# Patient Record
Sex: Female | Born: 1965
Health system: Southern US, Community
[De-identification: ages and names within clinical notes are randomized; demographics above are authoritative.]

## PROBLEM LIST (undated history)

## (undated) DIAGNOSIS — Z860100 Personal history of colon polyps, unspecified: Secondary | ICD-10-CM

## (undated) DIAGNOSIS — F319 Bipolar disorder, unspecified: Secondary | ICD-10-CM

## (undated) DIAGNOSIS — F209 Schizophrenia, unspecified: Secondary | ICD-10-CM

## (undated) DIAGNOSIS — J302 Other seasonal allergic rhinitis: Secondary | ICD-10-CM

## (undated) DIAGNOSIS — N390 Urinary tract infection, site not specified: Secondary | ICD-10-CM

## (undated) DIAGNOSIS — F79 Unspecified intellectual disabilities: Secondary | ICD-10-CM

## (undated) DIAGNOSIS — D649 Anemia, unspecified: Secondary | ICD-10-CM

## (undated) HISTORY — DX: Other seasonal allergic rhinitis: J30.2

## (undated) HISTORY — DX: Urinary tract infection, site not specified: N39.0

## (undated) HISTORY — DX: Personal history of colon polyps, unspecified: Z86.0100

## (undated) HISTORY — DX: Personal history of colonic polyps: Z86.010

## (undated) HISTORY — PX: NO PAST SURGERIES: SHX2092

---

## 2011-06-19 ENCOUNTER — Inpatient Hospital Stay: Payer: Self-pay | Admitting: Psychiatry

## 2012-05-12 ENCOUNTER — Encounter: Payer: Self-pay | Admitting: Obstetrics and Gynecology

## 2014-05-20 DIAGNOSIS — N760 Acute vaginitis: Secondary | ICD-10-CM | POA: Diagnosis not present

## 2014-05-20 DIAGNOSIS — R34 Anuria and oliguria: Secondary | ICD-10-CM | POA: Diagnosis not present

## 2014-07-08 DIAGNOSIS — D72819 Decreased white blood cell count, unspecified: Secondary | ICD-10-CM | POA: Diagnosis not present

## 2014-07-08 DIAGNOSIS — E876 Hypokalemia: Secondary | ICD-10-CM | POA: Diagnosis not present

## 2014-07-08 DIAGNOSIS — R1032 Left lower quadrant pain: Secondary | ICD-10-CM | POA: Diagnosis not present

## 2014-10-17 ENCOUNTER — Ambulatory Visit: Payer: Self-pay | Admitting: Physician Assistant

## 2014-10-17 DIAGNOSIS — R350 Frequency of micturition: Secondary | ICD-10-CM | POA: Diagnosis not present

## 2014-10-17 DIAGNOSIS — F319 Bipolar disorder, unspecified: Secondary | ICD-10-CM | POA: Diagnosis not present

## 2014-10-17 LAB — URINALYSIS, COMPLETE
Bacteria: NEGATIVE
Bilirubin,UR: NEGATIVE
Blood: NEGATIVE
Glucose,UR: NEGATIVE
Ketone: NEGATIVE
LEUKOCYTE ESTERASE: NEGATIVE
Nitrite: NEGATIVE
Ph: 6 (ref 5.0–8.0)
Protein: NEGATIVE
RBC,UR: NONE SEEN /HPF (ref 0–5)
SPECIFIC GRAVITY: 1.005 (ref 1.000–1.030)

## 2014-10-19 LAB — URINE CULTURE

## 2015-06-28 DIAGNOSIS — R319 Hematuria, unspecified: Secondary | ICD-10-CM | POA: Diagnosis not present

## 2015-06-28 DIAGNOSIS — N390 Urinary tract infection, site not specified: Secondary | ICD-10-CM | POA: Diagnosis not present

## 2015-07-07 ENCOUNTER — Ambulatory Visit
Admission: EM | Admit: 2015-07-07 | Discharge: 2015-07-07 | Disposition: A | Discharge status: Home / Self Care | Payer: Medicare Other | Attending: Family Medicine | Admitting: Family Medicine

## 2015-07-07 ENCOUNTER — Encounter: Payer: Self-pay | Admitting: Emergency Medicine

## 2015-07-07 ENCOUNTER — Ambulatory Visit: Payer: Medicare Other

## 2015-07-07 DIAGNOSIS — X58XXXA Exposure to other specified factors, initial encounter: Secondary | ICD-10-CM | POA: Diagnosis not present

## 2015-07-07 DIAGNOSIS — Z79899 Other long term (current) drug therapy: Secondary | ICD-10-CM | POA: Insufficient documentation

## 2015-07-07 DIAGNOSIS — M25571 Pain in right ankle and joints of right foot: Secondary | ICD-10-CM | POA: Diagnosis present

## 2015-07-07 DIAGNOSIS — S8263XA Displaced fracture of lateral malleolus of unspecified fibula, initial encounter for closed fracture: Secondary | ICD-10-CM | POA: Diagnosis not present

## 2015-07-07 DIAGNOSIS — S8261XA Displaced fracture of lateral malleolus of right fibula, initial encounter for closed fracture: Secondary | ICD-10-CM

## 2015-07-07 HISTORY — DX: Unspecified intellectual disabilities: F79

## 2015-07-07 MED ORDER — TRAMADOL HCL 50 MG PO TABS: 50.0000 mg | ORAL_TABLET | Freq: Three times a day (TID) | ORAL | Status: DC | PRN

## 2015-07-07 MED ORDER — MELOXICAM 15 MG PO TABS: 15.0000 mg | ORAL_TABLET | Freq: Every day | ORAL | Status: DC | PRN

## 2015-07-07 NOTE — Discharge Instructions (Signed)
Take medication as prescribed. Apply ice and elevate. Keep in splint. Use crutches.  Follow up with orthopedic next week. See above to call today to schedule for follow up.   Return to Urgent care for new or worsening concerns.   Ankle Fracture A fracture is a break in a bone. The ankle joint is made up of three bones. These include the lower (distal)sections of your lower leg bones, called the tibia and fibula, along with a bone in your foot, called the talus. Depending on how bad the break is and if more than one ankle joint bone is broken, a cast or splint is used to protect and keep your injured bone from moving while it heals. Sometimes, surgery is required to help the fracture heal properly.  There are two general types of fractures:  Stable fracture. This includes a single fracture line through one bone, with no injury to ankle ligaments. A fracture of the talus that does not have any displacement (movement of the bone on either side of the fracture line) is also stable.  Unstable fracture. This includes more than one fracture line through one or more bones in the ankle joint. It also includes fractures that have displacement of the bone on either side of the fracture line. CAUSES  A direct blow to the ankle.   Quickly and severely twisting your ankle.  Trauma, such as a car accident or falling from a significant height. RISK FACTORS You may be at a higher risk of ankle fracture if:  You have certain medical conditions.  You are involved in high-impact sports.  You are involved in a high-impact car accident. SIGNS AND SYMPTOMS   Tender and swollen ankle.  Bruising around the injured ankle.  Pain on movement of the ankle.  Difficulty walking or putting weight on the ankle.  A cold foot below the site of the ankle injury. This can occur if the blood vessels passing through your injured ankle were also damaged.  Numbness in the foot below the site of the ankle  injury. DIAGNOSIS  An ankle fracture is usually diagnosed with a physical exam and X-rays. A CT scan may also be required for complex fractures. TREATMENT  Stable fractures are treated with a cast or splint and using crutches to avoid putting weight on your injured ankle. This is followed by an ankle strengthening program. Some patients require a special type of cast, depending on other medical problems they may have. Unstable fractures require surgery to ensure the bones heal properly. Your health care provider will tell you what type of fracture you have and the best treatment for your condition. HOME CARE INSTRUCTIONS   Review correct crutch use with your health care provider and use your crutches as directed. Safe use of crutches is extremely important. Misuse of crutches can cause you to fall or cause injury to nerves in your hands or armpits.  Do not put weight or pressure on the injured ankle until directed by your health care provider.  To lessen the swelling, keep the injured leg elevated while sitting or lying down.  Apply ice to the injured area:  Put ice in a plastic bag.  Place a towel between your cast and the bag.  Leave the ice on for 20 minutes, 2-3 times a day.  If you have a plaster or fiberglass cast:  Do not try to scratch the skin under the cast with any objects. This can increase your risk of skin infection.  Check  the skin around the cast every day. You may put lotion on any red or sore areas.  Keep your cast dry and clean.  If you have a plaster splint:  Wear the splint as directed.  You may loosen the elastic around the splint if your toes become numb, tingle, or turn cold or blue.  Do not put pressure on any part of your cast or splint; it may break. Rest your cast only on a pillow the first 24 hours until it is fully hardened.  Your cast or splint can be protected during bathing with a plastic bag sealed to your skin with medical tape. Do not lower the  cast or splint into water.  Take medicines as directed by your health care provider. Only take over-the-counter or prescription medicines for pain, discomfort, or fever as directed by your health care provider.  Do not drive a vehicle until your health care provider specifically tells you it is safe to do so.  If your health care provider has given you a follow-up appointment, it is very important to keep that appointment. Not keeping the appointment could result in a chronic or permanent injury, pain, and disability. If you have any problem keeping the appointment, call the facility for assistance. SEEK MEDICAL CARE IF: You develop increased swelling or discomfort. SEEK IMMEDIATE MEDICAL CARE IF:   Your cast gets damaged or breaks.  You have continued severe pain.  You develop new pain or swelling after the cast was put on.  Your skin or toenails below the injury turn blue or gray.  Your skin or toenails below the injury feel cold, numb, or have loss of sensitivity to touch.  There is a bad smell or pus draining from under the cast. MAKE SURE YOU:   Understand these instructions.  Will watch your condition.  Will get help right away if you are not doing well or get worse. Document Released: 10/19/2000 Document Revised: 10/27/2013 Document Reviewed: 05/21/2013 [facility] Patient Information 2015 [place], [place]. This information is not intended to replace advice given to you by your health care provider. Make sure you discuss any questions you have with your health care provider.

## 2015-07-07 NOTE — ED Provider Notes (Signed)
[facility] Emergency Department Provider Note  ____________________________________________  Time seen: Approximately 10:51 AM  I have reviewed the triage vital signs and the nursing notes.   HISTORY  Chief Complaint Ankle Pain   HPI Tina Clark is a 49 y.o. female presents for complaints of right ankle pain. States going down her steps at home last night and missed next to last step and rolled right ankle. States the feel with right ankle underneath her. Denies head injury or LOC. States fell in sitting position. Denies neck or back pain. States pain is primarily with weight bearing. States pain at rest is minimal, pain with ambulating 5/10 and aching. Denies pain radiation. Denies numbness or tingling sensations.    Past Medical History  Diagnosis Date  . Mental impairment     There are no active problems to display for this patient.   History reviewed. No pertinent past surgical history.  Current Outpatient Rx  Name  Route  Sig  Dispense  Refill  . benztropine (COGENTIN) 2 MG tablet   Oral   Take 2 mg by mouth 2 (two) times daily.         . haloperidol (HALDOL) 5 MG tablet   Oral   Take 5 mg by mouth 2 (two) times daily.         . haloperidol decanoate (HALDOL DECANOATE) 50 MG/ML injection   Intramuscular   Inject 50 mg into the muscle every 28 (twenty-eight) days.          PCP in [place]  Allergies Review of patient's allergies indicates no known allergies.  History reviewed. No pertinent family history.  Social History Social History  Substance Use Topics  . Smoking status: Never Smoker   . Smokeless tobacco: None  . Alcohol Use: No    Review of Systems Constitutional: No fever/chills Eyes: No visual changes. ENT: No sore throat. Cardiovascular: Denies chest pain. Respiratory: Denies shortness of breath. Gastrointestinal: No abdominal pain.  No nausea, no vomiting.  No diarrhea.  No constipation. Genitourinary:  Negative for dysuria. Musculoskeletal: Negative for back pain.right ankle pain.  Skin: Negative for rash. Neurological: Negative for headaches, focal weakness or numbness.  10-point ROS otherwise negative.  ____________________________________________   PHYSICAL EXAM:  VITAL SIGNS: ED Triage Vitals  Enc Vitals Group     BP 07/07/15 1035 122/77 mmHg     Pulse Rate 07/07/15 1035 62     Resp 07/07/15 1035 18     Temp 07/07/15 1035 98.2 F (36.8 C)     Temp Source 07/07/15 1035 Oral     SpO2 07/07/15 1035 98 %     Weight 07/07/15 1035 195 lb (88.451 kg)     Height 07/07/15 1035 6\' 1"  (1.854 m)     Head Cir --      Peak Flow --      Pain Score 07/07/15 1040 5     Pain Loc --      Pain Edu? --      Excl. in [place]? --     Constitutional: Alert and oriented. Well appearing and in no acute distress. Eyes: Conjunctivae are normal. PERRL. EOMI. Head: Atraumatic. Nontender. No swelling or ecchymosis.  Nose: No congestion/rhinnorhea.  Mouth/Throat: Mucous membranes are moist.  Oropharynx non-erythematous. Neck: No stridor.  No cervical spine tenderness to palpation. Hematological/Lymphatic/Immunilogical: No cervical lymphadenopathy. Cardiovascular: Normal rate, regular rhythm. Grossly normal heart sounds.  Good peripheral circulation. Respiratory: Normal respiratory effort.  No retractions. Lungs CTAB. Gastrointestinal: Soft  and nontender. No distention. Normal Bowel sounds.   Musculoskeletal: No lower or upper extremity tenderness nor edema.  No joint effusions. Bilateral pedal pulses equal and easily palpated. No cervical, thoracic or lumbar tenderness to palpation. Except: right lateral ankle mod TTP with mod swelling, right anterior ankle mild TTP without swelling. Full ROM. Pain with ankle rotation. Bilateral pedal pulses equal and easily palpated. Right foot nontender, Right lower leg otherwise nontender.  Neurologic:  Normal speech and language. No gross focal neurologic deficits  are appreciated. No gait instability. Skin:  Skin is warm, dry and intact. No rash noted. Psychiatric: Mood and affect are normal. Speech and behavior are normal.  ____________________________________________   LABS (all labs ordered are listed, but only abnormal results are displayed)  Labs Reviewed - No data to display ____________________________________________  RADIOLOGY  EXAM: RIGHT ANKLE - COMPLETE 3+ VIEW  COMPARISON: None.  FINDINGS: Lateral greater than medial soft tissue swelling. Avulsion fracture involves the lateral malleolus. Base of fifth metatarsal and talar dome intact.  IMPRESSION: Lateral malleolar avulsion fracture with bimalleolar soft tissue swelling.   Electronically Signed By: Abigail Miyamoto M.D. On: 07/07/2015 11:59      I, Marylene Land, personally viewed and evaluated these images (plain radiographs) as part of my medical decision making.   ____________________________________________   PROCEDURES  Procedure(s) performed:  ocl stirrup splint applied by RN. Crutches given by RN. Neurovascular intact post application.  ____________________________________________   INITIAL IMPRESSION / ASSESSMENT AND PLAN / ED COURSE  Pertinent labs & imaging results that were available during my care of the patient were reviewed by me and considered in my medical decision making (see chart for details).  Well appearing. No acute distress. PResents post mechanical fall on to right ankle yesterday at home, after missing step and rolling ankle. Pain since. Right lateral malleolar avulsion fracture. Placed in stirrup ocl splint and crutches. Counseled no weight bearing, ice and elevation. Follow up with orthopedic next week, information given. PRN tramadol and mobic. Discussed follow-up and return parameters. Patient verbalized understanding and agreed to plan. ____________________________________________   FINAL CLINICAL IMPRESSION(S) / ED  DIAGNOSES  Final diagnoses:  Lateral malleolar fracture, right, closed, initial encounter       Marylene Land, NP 07/07/15 1235

## 2015-07-07 NOTE — ED Notes (Signed)
Pt with right ankle paim  After falling last night

## 2015-07-19 DIAGNOSIS — M79661 Pain in right lower leg: Secondary | ICD-10-CM | POA: Diagnosis not present

## 2015-07-19 DIAGNOSIS — S82831A Other fracture of upper and lower end of right fibula, initial encounter for closed fracture: Secondary | ICD-10-CM | POA: Diagnosis not present

## 2015-08-10 DIAGNOSIS — M25571 Pain in right ankle and joints of right foot: Secondary | ICD-10-CM | POA: Diagnosis not present

## 2015-08-10 DIAGNOSIS — S82831D Other fracture of upper and lower end of right fibula, subsequent encounter for closed fracture with routine healing: Secondary | ICD-10-CM | POA: Diagnosis not present

## 2016-06-09 ENCOUNTER — Ambulatory Visit
Admission: EM | Admit: 2016-06-09 | Discharge: 2016-06-09 | Disposition: A | Discharge status: Home / Self Care | Payer: Medicare Other | Attending: Family Medicine | Admitting: Family Medicine

## 2016-06-09 DIAGNOSIS — Z79899 Other long term (current) drug therapy: Secondary | ICD-10-CM | POA: Insufficient documentation

## 2016-06-09 DIAGNOSIS — N390 Urinary tract infection, site not specified: Secondary | ICD-10-CM

## 2016-06-09 LAB — URINALYSIS COMPLETE WITH MICROSCOPIC (ARMC ONLY)
Bilirubin Urine: NEGATIVE
GLUCOSE, UA: NEGATIVE mg/dL
Hgb urine dipstick: NEGATIVE
Ketones, ur: NEGATIVE mg/dL
NITRITE: NEGATIVE
PROTEIN: NEGATIVE mg/dL
Specific Gravity, Urine: 1.015 (ref 1.005–1.030)
pH: 5.5 (ref 5.0–8.0)

## 2016-06-09 MED ORDER — CIPROFLOXACIN HCL 500 MG PO TABS: 500.0000 mg | ORAL_TABLET | Freq: Two times a day (BID) | ORAL | 0 refills | Status: DC

## 2016-06-09 NOTE — ED Provider Notes (Signed)
CSN: [id number]     Arrival date & time 06/09/16  1547 History   None    Chief Complaint  Patient presents with  . Urinary Tract Infection   HPI The patient Presents today for evaluation of burning sensation with urination. She reports that this is been going on for 2 weeks, denies any new sexual partner or blood in her urine. She does know to increase frequency. She describes a burning sensation and a odor with urination. Presents today for urinalysis and possible treatment UTI. Past Medical History:  Diagnosis Date  . Mental impairment    Past Surgical History:  Procedure Laterality Date  . NO PAST SURGERIES     History reviewed. No pertinent family history. Social History  Substance Use Topics  . Smoking status: Never Smoker  . Smokeless tobacco: Never Used  . Alcohol use No   OB History    No data available     Review of Systems  Constitutional: Negative.   HENT: Negative.   Eyes: Negative.   Respiratory: Negative.   Cardiovascular: Negative.   Gastrointestinal: Negative.   Endocrine: Negative.   Genitourinary: Positive for dysuria, frequency and urgency.  Musculoskeletal: Negative.   Skin: Negative.   Allergic/Immunologic: Negative.   Neurological: Negative.   Hematological: Negative.   Psychiatric/Behavioral: Negative.     Allergies  Review of patient's allergies indicates no known allergies.  Home Medications   Prior to Admission medications   Medication Sig Start Date End Date Taking? Authorizing Provider  benztropine (COGENTIN) 2 MG tablet Take 2 mg by mouth 2 (two) times daily.   Yes Historical Provider, MD  haloperidol decanoate (HALDOL DECANOATE) 50 MG/ML injection Inject 180 mg into the muscle every 28 (twenty-eight) days.    Yes Historical Provider, MD  ciprofloxacin (CIPRO) 500 MG tablet Take 1 tablet (500 mg total) by mouth every 12 (twelve) hours. 06/09/16   Lattie Corns, PA-C  haloperidol (HALDOL) 5 MG tablet Take 5 mg by mouth 2 (two) times  daily.    Historical Provider, MD  traMADol (ULTRAM) 50 MG tablet Take 1 tablet (50 mg total) by mouth every 8 (eight) hours as needed (Do not drive or operate machinery while taking as can cause drowsiness.). 07/07/15   Marylene Land, NP   Meds Ordered and Administered this Visit  Medications - No data to display  BP (!) 114/56 (BP Location: Left Arm)   Pulse 86   Temp 97.8 F (36.6 C) (Tympanic)   Resp 17   Wt 201 lb (91.2 kg)   SpO2 100%   BMI 26.52 kg/m  No data found.  Physical Exam  Constitutional: She appears well-developed and well-nourished. No distress.  HENT:  Head: Normocephalic and atraumatic.  Cardiovascular: Normal rate and regular rhythm.   Abdominal: Soft.  Skin: She is not diaphoretic.    Urgent Care Course   Clinical Course   Procedures (including critical care time)  Labs Review Labs Reviewed  URINALYSIS COMPLETEWITH MICROSCOPIC ([facility] ONLY) - Abnormal; Notable for the following:       Result Value   Leukocytes, UA SMALL (*)    Bacteria, UA RARE (*)    Squamous Epithelial / LPF 0-5 (*)    All other components within normal limits   MDM   1. UTI (lower urinary tract infection)    1.  Treatment options discussed with patient. 2.  Cipro 500mg  BID x 7 days prescribed. 3.  Follow-up with PCP if symptoms persist or worsen.  Lattie Corns, [place] 06/09/16 [phone]

## 2016-06-09 NOTE — ED Triage Notes (Signed)
Patient complains of burning sensation with urination that has now stopped. Patient states that started 4 weeks ago and has noticed some diarrhea that has since stopped. Patient states that she wants to know if she has a UTI. Patient states that she took a bath and she thinks this helped.

## 2016-06-30 ENCOUNTER — Ambulatory Visit
Admission: EM | Admit: 2016-06-30 | Discharge: 2016-06-30 | Disposition: A | Discharge status: Home / Self Care | Payer: Medicare Other | Attending: Family Medicine | Admitting: Family Medicine

## 2016-06-30 DIAGNOSIS — N390 Urinary tract infection, site not specified: Secondary | ICD-10-CM | POA: Diagnosis not present

## 2016-06-30 LAB — URINALYSIS COMPLETE WITH MICROSCOPIC (ARMC ONLY)
BILIRUBIN URINE: NEGATIVE
Glucose, UA: NEGATIVE mg/dL
KETONES UR: NEGATIVE mg/dL
Leukocytes, UA: NEGATIVE
NITRITE: NEGATIVE
PH: 5.5 (ref 5.0–8.0)
Protein, ur: NEGATIVE mg/dL
SPECIFIC GRAVITY, URINE: 1.01 (ref 1.005–1.030)
WBC UA: NONE SEEN WBC/hpf (ref 0–5)

## 2016-06-30 MED ORDER — NITROFURANTOIN MONOHYD MACRO 100 MG PO CAPS: 100.0000 mg | ORAL_CAPSULE | Freq: Two times a day (BID) | ORAL | 0 refills | Status: DC

## 2016-06-30 NOTE — ED Triage Notes (Signed)
As per patient was here onset 2 week for UTI but still has symptoms has frequent urination.

## 2016-06-30 NOTE — Discharge Instructions (Signed)
Take Abx as directed. Follow-up as needed.

## 2016-06-30 NOTE — ED Provider Notes (Signed)
CSN: [id number]     Arrival date & time 06/30/16  1003 History   None    Chief Complaint  Patient presents with  . Recurrent UTI   (Consider location/radiation/quality/duration/timing/severity/associated sxs/prior Treatment) HPI The patient presents today for evaluation of burning sensation with urination ongoing for several days in the past.  Treated with Cipro which relieved her symptoms.  Over the last several days she has had increased odor with urination, denies any new sexual partners or discharge. Past Medical History:  Diagnosis Date  . Mental impairment    Past Surgical History:  Procedure Laterality Date  . NO PAST SURGERIES     No family history on file. Social History  Substance Use Topics  . Smoking status: Never Smoker  . Smokeless tobacco: Never Used  . Alcohol use No   OB History    No data available     Review of Systems  Constitutional: Negative.   HENT: Negative.   Eyes: Negative.   Respiratory: Negative.   Cardiovascular: Negative.   Gastrointestinal: Negative.   Endocrine: Negative.   Genitourinary: Positive for dysuria, frequency and urgency.  Musculoskeletal: Negative.   Skin: Negative.   Allergic/Immunologic: Negative.   Neurological: Negative.   Hematological: Negative.   Psychiatric/Behavioral: Negative.     Allergies  Review of patient's allergies indicates no known allergies.  Home Medications   Prior to Admission medications   Medication Sig Start Date End Date Taking? Authorizing Provider  benztropine (COGENTIN) 2 MG tablet Take 2 mg by mouth 2 (two) times daily.   Yes Historical Provider, MD  ciprofloxacin (CIPRO) 500 MG tablet Take 1 tablet (500 mg total) by mouth every 12 (twelve) hours. 06/09/16  Yes Lattie Corns, PA-C  haloperidol (HALDOL) 5 MG tablet Take 5 mg by mouth 2 (two) times daily.   Yes Historical Provider, MD  haloperidol decanoate (HALDOL DECANOATE) 50 MG/ML injection Inject 180 mg into the muscle every 28  (twenty-eight) days.    Yes Historical Provider, MD  traMADol (ULTRAM) 50 MG tablet Take 1 tablet (50 mg total) by mouth every 8 (eight) hours as needed (Do not drive or operate machinery while taking as can cause drowsiness.). 07/07/15  Yes Marylene Land, NP  nitrofurantoin, macrocrystal-monohydrate, (MACROBID) 100 MG capsule Take 1 capsule (100 mg total) by mouth 2 (two) times daily. 06/30/16   Lattie Corns, PA-C   Meds Ordered and Administered this Visit  Medications - No data to display  BP 129/79 (BP Location: Left Arm)   Pulse 67   Temp 97.8 F (36.6 C) (Tympanic)   Resp 16   Ht 6' (1.829 m)   Wt 207 lb (93.9 kg)   SpO2 99%   BMI 28.07 kg/m  No data found.  Physical Exam Constitutional: She appears well-developed and well-nourished. No distress.  HENT:  Head: Normocephalic and atraumatic.  Cardiovascular: Normal rate and regular rhythm.   Abdominal: Soft.  Skin: She is not diaphoretic.  Urgent Care Course   Clinical Course    Procedures (including critical care time)  Labs Review Labs Reviewed  URINALYSIS COMPLETEWITH MICROSCOPIC ([place]) - Abnormal; Notable for the following:       Result Value   Hgb urine dipstick TRACE (*)    Bacteria, UA RARE (*)    Squamous Epithelial / LPF 0-5 (*)    All other components within normal limits  URINE CULTURE    Imaging Review No results found.  MDM   1. UTI (lower urinary tract infection)   -  Treatment options discussed today with the patient. -  Macrobid 100mg  BID x 7 days, Urine Culture ordered as well. -  Follow-up with PCP if symptoms persist or worsen    Lattie Corns, PA-C 06/30/16 1126

## 2016-07-02 LAB — URINE CULTURE
Culture: NO GROWTH
Special Requests: NORMAL

## 2016-08-13 ENCOUNTER — Ambulatory Visit
Admission: EM | Admit: 2016-08-13 | Discharge: 2016-08-13 | Disposition: A | Discharge status: Home / Self Care | Payer: Medicare Other | Attending: Family Medicine | Admitting: Family Medicine

## 2016-08-13 DIAGNOSIS — S86812A Strain of other muscle(s) and tendon(s) at lower leg level, left leg, initial encounter: Secondary | ICD-10-CM | POA: Diagnosis not present

## 2016-08-13 DIAGNOSIS — Z87440 Personal history of urinary (tract) infections: Secondary | ICD-10-CM | POA: Diagnosis present

## 2016-08-13 DIAGNOSIS — N390 Urinary tract infection, site not specified: Secondary | ICD-10-CM | POA: Diagnosis not present

## 2016-08-13 LAB — URINALYSIS COMPLETE WITH MICROSCOPIC (ARMC ONLY)
BACTERIA UA: NONE SEEN
Bilirubin Urine: NEGATIVE
Glucose, UA: NEGATIVE mg/dL
KETONES UR: NEGATIVE mg/dL
Nitrite: NEGATIVE
PH: 6.5 (ref 5.0–8.0)
PROTEIN: NEGATIVE mg/dL
RBC / HPF: NONE SEEN RBC/hpf (ref 0–5)
Specific Gravity, Urine: 1.01 (ref 1.005–1.030)

## 2016-08-13 NOTE — ED Triage Notes (Signed)
Patient complains of left lower leg pain below the knee that started 3 months ago. Patient states that pain comes and goes. Patient states that the pain started after having a bean and beef burrito.    Patient states that she is also here to follow up about her UTI. Patient states that she has not had any symptoms. Patient states that she wants to check her urine to make sure infection has disappeared.

## 2016-08-15 ENCOUNTER — Telehealth: Payer: Self-pay

## 2016-08-15 NOTE — Telephone Encounter (Signed)
Courtesy call back completed today for patients recent visit at [facility]. Patient did not answer, left message on voicemail to call back with any questions or concerns.   

## 2016-08-15 NOTE — ED Provider Notes (Signed)
[facility]    CSN: [id number] Arrival date & time: 08/13/16  1555     History   Chief Complaint Chief Complaint  Patient presents with  . Urinary Tract Infection  . Leg Pain    left     HPI Tina Clark is a 50 y.o. female.   Patient complains of left lower leg pain below the knee that started 3 months ago. Patient states that pain comes and goes. Denies any injury. Patient states that the pain started after having a bean and beef burrito.    Patient states that she is also here to follow up about her UTI. Patient states that she has not had any symptoms. Patient states that she wants to check her urine to make sure infection has disappeared.         The history is provided by the patient.  Urinary Tract Infection  Leg Pain    Past Medical History:  Diagnosis Date  . Mental impairment     There are no active problems to display for this patient.   Past Surgical History:  Procedure Laterality Date  . NO PAST SURGERIES      OB History    No data available       Home Medications    Prior to Admission medications   Medication Sig Start Date End Date Taking? Authorizing Provider  benztropine (COGENTIN) 2 MG tablet Take 2 mg by mouth 2 (two) times daily.   Yes Historical Provider, MD  haloperidol (HALDOL) 5 MG tablet Take 5 mg by mouth 2 (two) times daily.   Yes Historical Provider, MD  haloperidol decanoate (HALDOL DECANOATE) 50 MG/ML injection Inject 180 mg into the muscle every 28 (twenty-eight) days.    Yes Historical Provider, MD  ciprofloxacin (CIPRO) 500 MG tablet Take 1 tablet (500 mg total) by mouth every 12 (twelve) hours. 06/09/16   Lattie Corns, PA-C  nitrofurantoin, macrocrystal-monohydrate, (MACROBID) 100 MG capsule Take 1 capsule (100 mg total) by mouth 2 (two) times daily. 06/30/16   Lattie Corns, PA-C  traMADol (ULTRAM) 50 MG tablet Take 1 tablet (50 mg total) by mouth every 8 (eight) hours as needed (Do not drive or  operate machinery while taking as can cause drowsiness.). 07/07/15   Marylene Land, NP    Family History History reviewed. No pertinent family history.  Social History Social History  Substance Use Topics  . Smoking status: Never Smoker  . Smokeless tobacco: Never Used  . Alcohol use No     Allergies   Review of patient's allergies indicates no known allergies.   Review of Systems Review of Systems   Physical Exam Triage Vital Signs ED Triage Vitals  Enc Vitals Group     BP 08/13/16 1640 (!) 141/81     Pulse Rate 08/13/16 1640 61     Resp 08/13/16 1640 16     Temp 08/13/16 1640 97.4 F (36.3 C)     Temp Source 08/13/16 1640 Oral     SpO2 08/13/16 1640 100 %     Weight 08/13/16 1641 207 lb (93.9 kg)     Height 08/13/16 1641 6' (1.829 m)     Head Circumference --      Peak Flow --      Pain Score 08/13/16 1642 4     Pain Loc --      Pain Edu? --      Excl. in [place]? --    No data found.  Updated Vital Signs BP (!) 141/81 (BP Location: Left Arm)   Pulse 61   Temp 97.4 F (36.3 C) (Oral)   Resp 16   Ht 6' (1.829 m)   Wt 207 lb (93.9 kg)   SpO2 100%   BMI 28.07 kg/m   Visual Acuity Right Eye Distance:   Left Eye Distance:   Bilateral Distance:    Right Eye Near:   Left Eye Near:    Bilateral Near:     Physical Exam  Constitutional: She appears well-developed and well-nourished. No distress.  Abdominal: Soft. Bowel sounds are normal. She exhibits no distension and no mass. There is no tenderness. There is no rebound and no guarding.  Musculoskeletal:       Left knee: She exhibits normal range of motion, no swelling, no effusion, no ecchymosis, no deformity, no laceration, no erythema, normal alignment, no LCL laxity, normal patellar mobility, no bony tenderness, normal meniscus and no MCL laxity. No tenderness found.       Left lower leg: She exhibits tenderness (over the shin and calf muscle). She exhibits no bony tenderness, no swelling, no edema, no  deformity and no laceration.  Skin: She is not diaphoretic.  Nursing note and vitals reviewed.    UC Treatments / Results  Labs (all labs ordered are listed, but only abnormal results are displayed) Labs Reviewed  URINALYSIS COMPLETEWITH MICROSCOPIC ([place]) - Abnormal; Notable for the following:       Result Value   Color, Urine STRAW (*)    Hgb urine dipstick TRACE (*)    Leukocytes, UA TRACE (*)    Squamous Epithelial / LPF 0-5 (*)    All other components within normal limits    EKG  EKG Interpretation None       Radiology No results found.  Procedures Procedures (including critical care time)  Medications Ordered in UC Medications - No data to display   Initial Impression / Assessment and Plan / UC Course  I have reviewed the triage vital signs and the nursing notes.  Pertinent labs & imaging results that were available during my care of the patient were reviewed by me and considered in my medical decision making (see chart for details).  Clinical Course      Final Clinical Impressions(s) / UC Diagnoses   Final diagnoses:  Strain of calf muscle, left, initial encounter    New Prescriptions Discharge Medication List as of 08/13/2016  5:17 PM     1.  diagnosis reviewed with patient 2. Recommend supportive treatment with rest, ice, stretching 3. Follow-up prn if symptoms worsen or don't improve   Norval Gable, MD 08/15/16 1101

## 2017-02-05 ENCOUNTER — Ambulatory Visit
Admission: EM | Admit: 2017-02-05 | Discharge: 2017-02-05 | Disposition: A | Discharge status: Home / Self Care | Payer: Medicare Other | Attending: Family Medicine | Admitting: Family Medicine

## 2017-02-05 DIAGNOSIS — G3184 Mild cognitive impairment, so stated: Secondary | ICD-10-CM | POA: Insufficient documentation

## 2017-02-05 DIAGNOSIS — N390 Urinary tract infection, site not specified: Secondary | ICD-10-CM | POA: Insufficient documentation

## 2017-02-05 DIAGNOSIS — R300 Dysuria: Secondary | ICD-10-CM | POA: Diagnosis not present

## 2017-02-05 LAB — URINALYSIS, COMPLETE (UACMP) WITH MICROSCOPIC
Glucose, UA: NEGATIVE mg/dL
KETONES UR: 15 mg/dL — AB
NITRITE: NEGATIVE
PH: 6 (ref 5.0–8.0)
Protein, ur: NEGATIVE mg/dL
SPECIFIC GRAVITY, URINE: 1.02 (ref 1.005–1.030)

## 2017-02-05 MED ORDER — NITROFURANTOIN MONOHYD MACRO 100 MG PO CAPS: 100.0000 mg | ORAL_CAPSULE | Freq: Two times a day (BID) | ORAL | 0 refills | Status: DC

## 2017-02-05 NOTE — ED Provider Notes (Signed)
[facility]    CSN: [id number] Arrival date & time: 02/05/17  1659     History   Chief Complaint Chief Complaint  Patient presents with  . Urinary Tract Infection    HPI Tina Clark is a 51 y.o. female.   The history is provided by the patient.  Urinary Tract Infection  Associated symptoms: no abdominal pain, no fever, no flank pain, no genital lesions, no nausea, no vaginal discharge and no vomiting   Dysuria  Pain quality:  Burning Pain severity:  Moderate Onset quality:  Sudden Duration:  7 days Timing:  Constant Progression:  Worsening Chronicity:  New Recent urinary tract infections: no   Relieved by:  None tried Worsened by:  Nothing Ineffective treatments:  None tried Associated symptoms: no abdominal pain, no fever, no flank pain, no genital lesions, no nausea, no vaginal discharge and no vomiting   Risk factors: no hx of pyelonephritis, no hx of urolithiasis, no kidney transplant, not pregnant, no recurrent urinary tract infections, no renal cysts, no renal disease, not sexually active, no sexually transmitted infections, no single kidney and no urinary catheter     Past Medical History:  Diagnosis Date  . Mental impairment     There are no active problems to display for this patient.   Past Surgical History:  Procedure Laterality Date  . NO PAST SURGERIES      OB History    No data available       Home Medications    Prior to Admission medications   Medication Sig Start Date End Date Taking? Authorizing Provider  benztropine (COGENTIN) 2 MG tablet Take 2 mg by mouth 2 (two) times daily.   Yes Historical Provider, MD  haloperidol (HALDOL) 5 MG tablet Take 5 mg by mouth 2 (two) times daily.   Yes Historical Provider, MD  haloperidol decanoate (HALDOL DECANOATE) 50 MG/ML injection Inject 180 mg into the muscle every 28 (twenty-eight) days.     Historical Provider, MD  nitrofurantoin, macrocrystal-monohydrate, (MACROBID) 100 MG  capsule Take 1 capsule (100 mg total) by mouth 2 (two) times daily. 02/05/17   Norval Gable, MD    Family History History reviewed. No pertinent family history.  Social History Social History  Substance Use Topics  . Smoking status: Never Smoker  . Smokeless tobacco: Never Used  . Alcohol use No     Allergies   Patient has no known allergies.   Review of Systems Review of Systems  Constitutional: Negative for fever.  Gastrointestinal: Negative for abdominal pain, nausea and vomiting.  Genitourinary: Positive for dysuria. Negative for flank pain and vaginal discharge.     Physical Exam Triage Vital Signs ED Triage Vitals  Enc Vitals Group     BP 02/05/17 1810 135/81     Pulse Rate 02/05/17 1810 81     Resp 02/05/17 1810 18     Temp 02/05/17 1810 97.5 F (36.4 C)     Temp Source 02/05/17 1810 Oral     SpO2 02/05/17 1810 99 %     Weight 02/05/17 1811 209 lb (94.8 kg)     Height 02/05/17 1811 6\' 1"  (1.854 m)     Head Circumference --      Peak Flow --      Pain Score --      Pain Loc --      Pain Edu? --      Excl. in [place]? --    No data found.  Updated Vital Signs BP 135/81 (BP Location: Left Arm)   Pulse 81   Temp 97.5 F (36.4 C) (Oral)   Resp 18   Ht 6\' 1"  (1.854 m)   Wt 209 lb (94.8 kg)   SpO2 99%   BMI 27.57 kg/m   Visual Acuity Right Eye Distance:   Left Eye Distance:   Bilateral Distance:    Right Eye Near:   Left Eye Near:    Bilateral Near:     Physical Exam  Constitutional: She appears well-developed and well-nourished. No distress.  Abdominal: Soft. Bowel sounds are normal. She exhibits no distension and no mass. There is no tenderness. There is no rebound and no guarding.  Skin: She is not diaphoretic.  Nursing note and vitals reviewed.    UC Treatments / Results  Labs (all labs ordered are listed, but only abnormal results are displayed) Labs Reviewed  URINALYSIS, COMPLETE (UACMP) WITH MICROSCOPIC - Abnormal; Notable for the  following:       Result Value   Hgb urine dipstick TRACE (*)    Bilirubin Urine SMALL (*)    Ketones, ur 15 (*)    Leukocytes, UA TRACE (*)    Squamous Epithelial / LPF 6-30 (*)    Bacteria, UA RARE (*)    All other components within normal limits    EKG  EKG Interpretation None       Radiology No results found.  Procedures Procedures (including critical care time)  Medications Ordered in UC Medications - No data to display   Initial Impression / Assessment and Plan / UC Course  I have reviewed the triage vital signs and the nursing notes.  Pertinent labs & imaging results that were available during my care of the patient were reviewed by me and considered in my medical decision making (see chart for details).       Final Clinical Impressions(s) / UC Diagnoses   Final diagnoses:  Dysuria    New Prescriptions Discharge Medication List as of 02/05/2017  6:30 PM    START taking these medications   Details  nitrofurantoin, macrocrystal-monohydrate, (MACROBID) 100 MG capsule Take 1 capsule (100 mg total) by mouth 2 (two) times daily., Starting Tue 02/05/2017, Normal       1. Lab results and diagnosis reviewed with patient 2. rx as per orders above; reviewed possible side effects, interactions, risks and benefits  3. Recommend supportive treatment with increased fluids, otc analgesics prn 4. Follow-up prn if symptoms worsen or don't improve   Norval Gable, MD 02/05/17 [phone]

## 2017-02-05 NOTE — ED Triage Notes (Signed)
Pt c/o burning while urinating.

## 2017-02-08 ENCOUNTER — Telehealth: Payer: Self-pay

## 2017-03-16 ENCOUNTER — Ambulatory Visit
Admission: EM | Admit: 2017-03-16 | Discharge: 2017-03-16 | Disposition: A | Discharge status: Home / Self Care | Payer: Medicare Other | Attending: Family Medicine | Admitting: Family Medicine

## 2017-03-16 ENCOUNTER — Ambulatory Visit: Payer: Medicare Other

## 2017-03-16 DIAGNOSIS — Z79899 Other long term (current) drug therapy: Secondary | ICD-10-CM | POA: Insufficient documentation

## 2017-03-16 DIAGNOSIS — M25562 Pain in left knee: Secondary | ICD-10-CM | POA: Diagnosis not present

## 2017-03-16 DIAGNOSIS — W19XXXA Unspecified fall, initial encounter: Secondary | ICD-10-CM | POA: Diagnosis not present

## 2017-03-16 DIAGNOSIS — S8992XA Unspecified injury of left lower leg, initial encounter: Secondary | ICD-10-CM | POA: Diagnosis not present

## 2017-03-16 DIAGNOSIS — S8991XA Unspecified injury of right lower leg, initial encounter: Secondary | ICD-10-CM | POA: Diagnosis not present

## 2017-03-16 DIAGNOSIS — M25561 Pain in right knee: Secondary | ICD-10-CM | POA: Diagnosis not present

## 2017-03-16 DIAGNOSIS — Z87440 Personal history of urinary (tract) infections: Secondary | ICD-10-CM

## 2017-03-16 LAB — URINALYSIS, COMPLETE (UACMP) WITH MICROSCOPIC
BACTERIA UA: NONE SEEN
Bilirubin Urine: NEGATIVE
Glucose, UA: NEGATIVE mg/dL
HGB URINE DIPSTICK: NEGATIVE
Ketones, ur: NEGATIVE mg/dL
Nitrite: NEGATIVE
PROTEIN: NEGATIVE mg/dL
RBC / HPF: NONE SEEN RBC/hpf (ref 0–5)
Specific Gravity, Urine: <1.005 — ABNORMAL LOW (ref 1.005–1.030)
pH: 5.5 (ref 5.0–8.0)

## 2017-03-16 NOTE — ED Provider Notes (Signed)
[facility] ____________________________________________  Time seen: Approximately 9:56 AM  I have reviewed the triage vital signs and the nursing notes.   HISTORY  Chief Complaint Knee Pain (right)  HPI Tina Clark is a 51 y.o. female present for evaluation of bilateral knee pain after a fall that occurred less than home. Patient states that she was going down her steps outside and was on the third from the bottom step, misstepped causing her to fall forward. Patient states that she fell directly on her knees bilaterally. Patient reports she had more pain to her knees yesterday and currently but states wanted to have evaluation. Patient states right   knee is currently nontender, states mild left knee pain at this time. States pain to both knees increase with ambulation. Reports resting and over-the-counter Tylenol or ibuprofen helps, denies other alleviating factors. Denies other aggravating factors. Denies pain radiation, paresthesias or other injury. Patient states she landed directly on her knees and did not hurt any other areas. Denies head injury or loss of consciousness. Reports has remained ambulatory since.  She also states that she was seen in urgent care a few weeks ago for urinary tract infection, and states that she completed antibiotics. Patient states that she also wanted her urine to be evaluated today to make sure her urine clear the infection. States no dysuria, vaginal complaints, abdominal pain or back pain.  Denies chest pain, shortness of breath, abdominal pain, or rash. Denies recent sickness. Denies recent antibiotic use.   No LMP recorded. Patient is not currently having periods (Reason: Perimenopausal).      Past Medical History:  Diagnosis Date  . Mental impairment     There are no active problems to display for this patient.   Past Surgical History:  Procedure Laterality Date  . NO PAST SURGERIES       No current  facility-administered medications for this encounter.   Current Outpatient Prescriptions:  .  benztropine (COGENTIN) 2 MG tablet, Take 2 mg by mouth 2 (two) times daily., Disp: , Rfl:  .  haloperidol decanoate (HALDOL DECANOATE) 50 MG/ML injection, Inject 180 mg into the muscle every 28 (twenty-eight) days. , Disp: , Rfl:  .  haloperidol (HALDOL) 5 MG tablet, Take 5 mg by mouth 2 (two) times daily., Disp: , Rfl:   Allergies Patient has no known allergies.  History reviewed. No pertinent family history.  Social History Social History  Substance Use Topics  . Smoking status: Never Smoker  . Smokeless tobacco: Never Used  . Alcohol use No    Review of Systems Constitutional: No fever/chills Eyes: No visual changes. Cardiovascular: Denies chest pain. Respiratory: Denies shortness of breath. Gastrointestinal: No abdominal pain.  No nausea, no vomiting.  No diarrhea.  No constipation. Genitourinary: Negative for dysuria. Musculoskeletal: Negative for back pain.As above. Skin: Negative for rash.  ____________________________________________   PHYSICAL EXAM:  VITAL SIGNS: ED Triage Vitals  Enc Vitals Group     BP 03/16/17 0922 118/73     Pulse Rate 03/16/17 0922 73     Resp 03/16/17 0922 18     Temp 03/16/17 0922 98.2 F (36.8 C)     Temp Source 03/16/17 0922 Oral     SpO2 03/16/17 0922 99 %     Weight 03/16/17 0922 209 lb (94.8 kg)     Height 03/16/17 0922 6\' 1"  (1.854 m)     Head Circumference --      Peak Flow --  Pain Score 03/16/17 0923 2     Pain Loc --      Pain Edu? --      Excl. in [place]? --     Constitutional: Alert and oriented. Well appearing and in no acute distress. Eyes: Conjunctivae are normal. PERRL. EOMI. ENT      Head: Normocephalic and atraumatic.      Nose: No congestion/rhinnorhea.      Mouth/Throat: Mucous membranes are moist.Oropharynx non-erythematous. Neck: No stridor. Supple without meningismus.  Hematological/Lymphatic/Immunilogical:  No cervical lymphadenopathy. Cardiovascular: Normal rate, regular rhythm. Grossly normal heart sounds.  Good peripheral circulation. Respiratory: Normal respiratory effort without tachypnea nor retractions. Breath sounds are clear and equal bilaterally. No wheezes, rales, rhonchi. Gastrointestinal: Soft and nontender. No distention. Normal Bowel sounds. No CVA tenderness. Musculoskeletal:  Nontender with normal range of motion in all extremities. No midline cervical, thoracic or lumbar tenderness to palpation. Bilateral pedal pulses equal and easily palpated. Except: Right knee minimal tenderness to direct palpation, no swelling, no ecchymosis, full range of motion present, no pain with anterior or posterior drawer test, no mediolateral stress test pain, full range of motion present. Except: Left anterior knee mild to moderate tenderness to palpation anteriorly, no pain with medial or lateral stress, no pain with anterior or posterior drawer test, minimal pain with resisted knee extension, no pain with resisted flexion.  Patient ambulatory with mild antalgic gait. Bilateral lower extremities otherwise nontender. Neurologic:  Normal speech and language. No gross focal neurologic deficits are appreciated. Speech is normal. No gait instability.  Skin:  Skin is warm, dry.  Psychiatric: Mood and affect are normal. Speech and behavior are normal. Patient exhibits appropriate insight and judgment   ___________________________________________   LABS (all labs ordered are listed, but only abnormal results are displayed)  Labs Reviewed  URINALYSIS, COMPLETE (UACMP) WITH MICROSCOPIC - Abnormal; Notable for the following:       Result Value   Color, Urine STRAW (*)    Specific Gravity, Urine <1.005 (*)    Leukocytes, UA TRACE (*)    Squamous Epithelial / LPF 0-5 (*)    All other components within normal limits   ____________________________________________ RADIOLOGY  Dg Knee Complete 4 Views  Left  Result Date: 03/16/2017 CLINICAL DATA:  Fall, knee pain. EXAM: LEFT KNEE - COMPLETE 4+ VIEW COMPARISON:  None. FINDINGS: Osseous alignment is normal. No fracture line or displaced fracture fragment identified. Probable joint effusion within the suprapatellar bursa. IMPRESSION: Probable small joint effusion.  No osseous fracture or dislocation. Electronically Signed   By: Franki Cabot M.D.   On: 03/16/2017 10:13   Dg Knee Complete 4 Views Right  Result Date: 03/16/2017 CLINICAL DATA:  Fall, knee pain. EXAM: RIGHT KNEE - COMPLETE 4+ VIEW COMPARISON:  None. FINDINGS: Osseous alignment is normal. No fracture line or displaced fracture fragment seen. No convincing evidence of joint effusion. Superficial soft tissues are unremarkable. IMPRESSION: Negative. Electronically Signed   By: Franki Cabot M.D.   On: 03/16/2017 10:13   ____________________________________________   PROCEDURES Procedures    INITIAL IMPRESSION / ASSESSMENT AND PLAN / ED COURSE  Pertinent labs & imaging results that were available during my care of the patient were reviewed by me and considered in my medical decision making (see chart for details).  Well appearing patient. No acute distress. Presenting for evaluation of bilateral knee pain post mechanical fall onto her knees and occurred yesterday at home. Discussed with patient evaluation of left knee x-ray as tenderness, patient requests  both knees. Bilateral knees evaluated by x-ray. Radiation per radiologist negative. Left knee x-ray per radiologist probable small joint effusion, new osseous fracture or dislocation. Left knee immobilizer applied for support. Encouraged rest, ice, elevation, over-the-counter Tylenol or ibuprofen. Discussed follow-up with primary care physician orthopedic has had a continued pain. Patient denies need for crutches and states that she has a walker and crutches at home as needed if pain continues.  Urinalysis also reviewed, negative for  bacteria. Suspect contaminated sample. Patient denies any dysuria, will defer urine culture, patient agrees.  Discussed follow up with Primary care physician this week. Discussed follow up and return parameters including no resolution or any worsening concerns. Patient verbalized understanding and agreed to plan.   ____________________________________________   FINAL CLINICAL IMPRESSION(S) / ED DIAGNOSES  Final diagnoses:  Acute pain of both knees  History of UTI     Discharge Medication List as of 03/16/2017 10:22 AM      Note: This dictation was prepared with Dragon dictation along with smaller phrase technology. Any transcriptional errors that result from this process are unintentional.         Marylene Land, NP 03/16/17 1217

## 2017-03-16 NOTE — ED Triage Notes (Signed)
Pt says she fell down the steps last night and landed on her knees on the concrete the right knee cap hurts really bad. She also wants to check and see if she has a UTI, she says she never has symptoms of UTI so she would like to check and see if the last one she had has cleared up.

## 2017-03-16 NOTE — Discharge Instructions (Signed)
Rest. Ice. Elevate.   Follow up with orthopedic as needed for continued pain.   Follow up with your primary care physician this week as needed. Return to Urgent care for new or worsening concerns.

## 2017-08-07 ENCOUNTER — Ambulatory Visit: Payer: Medicare Other | Admitting: Family

## 2017-08-14 ENCOUNTER — Ambulatory Visit: Payer: Medicare Other

## 2017-08-14 ENCOUNTER — Ambulatory Visit
Admission: EM | Admit: 2017-08-14 | Discharge: 2017-08-14 | Disposition: A | Discharge status: Home / Self Care | Payer: Medicare Other | Attending: Emergency Medicine | Admitting: Emergency Medicine

## 2017-08-14 ENCOUNTER — Encounter: Payer: Self-pay | Admitting: *Deleted

## 2017-08-14 DIAGNOSIS — M79605 Pain in left leg: Secondary | ICD-10-CM | POA: Diagnosis present

## 2017-08-14 DIAGNOSIS — M25462 Effusion, left knee: Secondary | ICD-10-CM | POA: Diagnosis not present

## 2017-08-14 DIAGNOSIS — M25562 Pain in left knee: Secondary | ICD-10-CM | POA: Diagnosis not present

## 2017-08-14 MED ORDER — IBUPROFEN 600 MG PO TABS: 600.0000 mg | ORAL_TABLET | Freq: Three times a day (TID) | ORAL | 0 refills | Status: DC | PRN

## 2017-08-14 MED ORDER — IBUPROFEN 800 MG PO TABS: 800.0000 mg | ORAL_TABLET | Freq: Once | ORAL | Status: DC

## 2017-08-14 NOTE — ED Triage Notes (Signed)
Patient started having left leg pain 4 days ago. Mechanism of injury unknown. No previous history of left leg injuries or problems.

## 2017-08-14 NOTE — ED Provider Notes (Signed)
[facility]    CSN: [id number] Arrival date & time: 08/14/17  1619     History   Chief Complaint Chief Complaint  Patient presents with  . Leg Pain    HPI Tina Clark is a 51 y.o. female.   The history is provided by the patient. No language interpreter was used.  Leg Pain  Location:  Leg Time since incident:  5 months Injury: yes   Mechanism of injury: fall   Fall:    Fall occurred:  Tripped   Height of fall:  From stance   Impact surface:  Unable to specify   Point of impact:  Knees   Entrapped after fall: no   Leg location:  L lower leg (knee/patella) Pain details:    Quality:  Aching   Radiates to:  Does not radiate   Severity:  Mild   Onset quality:  Unable to specify   Timing:  Intermittent   Progression:  Unchanged Chronicity:  New Dislocation: no   Foreign body present:  No foreign bodies Tetanus status:  Up to date Prior injury to area:  No Relieved by:  Rest Worsened by:  Bearing weight Ineffective treatments:  Rest Associated symptoms: swelling   Associated symptoms: no back pain, no decreased ROM, no fatigue, no fever, no itching, no muscle weakness, no neck pain, no numbness, no stiffness and no tingling   Risk factors: obesity     Past Medical History:  Diagnosis Date  . Mental impairment     Patient Active Problem List   Diagnosis Date Noted  . Acute pain of left knee 08/14/2017  . Effusion of left knee 08/14/2017    Past Surgical History:  Procedure Laterality Date  . NO PAST SURGERIES      OB History    No data available       Home Medications    Prior to Admission medications   Medication Sig Start Date End Date Taking? Authorizing Provider  benztropine (COGENTIN) 2 MG tablet Take 2 mg by mouth 2 (two) times daily.    [provider]  haloperidol (HALDOL) 5 MG tablet Take 5 mg by mouth 2 (two) times daily.    [provider]  haloperidol decanoate (HALDOL DECANOATE) 50 MG/ML injection  Inject 180 mg into the muscle every 28 (twenty-eight) days.     [provider]  ibuprofen (ADVIL,MOTRIN) 600 MG tablet Take 1 tablet (600 mg total) by mouth every 8 (eight) hours as needed. 16/60/63   DefeliceJeanett Schlein, NP    Family History History reviewed. No pertinent family history.  Social History Social History  Substance Use Topics  . Smoking status: Never Smoker  . Smokeless tobacco: Never Used  . Alcohol use No     Allergies   Patient has no known allergies.   Review of Systems Review of Systems  Constitutional: Negative for fatigue and fever.  Musculoskeletal: Negative for back pain, neck pain and stiffness.  Skin: Negative for itching.  All other systems reviewed and are negative.    Physical Exam Triage Vital Signs ED Triage Vitals  Enc Vitals Group     BP 08/14/17 1630 127/82     Pulse Rate 08/14/17 1630 62     Resp 08/14/17 1630 16     Temp 08/14/17 1630 98.4 F (36.9 C)     Temp Source 08/14/17 1630 Oral     SpO2 08/14/17 1630 100 %     Weight 08/14/17 1631 198 lb (89.8  kg)     Height 08/14/17 1631 6' (1.829 m)     Head Circumference --      Peak Flow --      Pain Score 08/14/17 1634 0     Pain Loc --      Pain Edu? --      Excl. in [place]? --    No data found.   Updated Vital Signs BP 127/82 (BP Location: Left Arm)   Pulse 62   Temp 98.4 F (36.9 C) (Oral)   Resp 16   Ht 6' (1.829 m)   Wt 198 lb (89.8 kg)   LMP 01/04/2015 Comment: medicine she is on makes her not have periods. denies preg  SpO2 100%   BMI 26.85 kg/m   Visual Acuity  Physical Exam  Constitutional: She is oriented to person, place, and time. Vital signs are normal. She appears well-developed and well-nourished. She is active and cooperative.  Cardiovascular: Normal rate and normal pulses.   Pulses:      Dorsalis pedis pulses are 2+ on the right side, and 2+ on the left side.  Musculoskeletal:       Left knee: She exhibits swelling and bony tenderness. She  exhibits normal range of motion, no effusion, no ecchymosis, no deformity, no laceration, no erythema, normal alignment, no LCL laxity, normal patellar mobility, normal meniscus and no MCL laxity. Tenderness found. Patellar tendon tenderness noted. No medial joint line, no lateral joint line, no MCL and no LCL tenderness noted.  Neurological: She is alert and oriented to person, place, and time. GCS eye subscore is 4. GCS verbal subscore is 5. GCS motor subscore is 6.  Psychiatric: She has a normal mood and affect. Her speech is normal.  Nursing note and vitals reviewed.    UC Treatments / Results  Labs (all labs ordered are listed, but only abnormal results are displayed) Labs Reviewed - No data to display  EKG  EKG Interpretation None       Radiology Dg Knee Complete 4 Views Left  Result Date: 08/14/2017 CLINICAL DATA:  Left leg pain for 4 days. EXAM: LEFT KNEE - COMPLETE 4+ VIEW COMPARISON:  03/16/2017 FINDINGS: No acute bony abnormality. Specifically, no fracture, subluxation, or dislocation. Soft tissues are intact. Small joint effusion IMPRESSION: No acute bony abnormality.  Small joint effusion. Electronically Signed   By: Rolm Baptise M.D.   On: 08/14/2017 17:38    Procedures Procedures (including critical care time)  Medications Ordered in UC Medications  ibuprofen (ADVIL,MOTRIN) tablet 800 mg (800 mg Oral Not Given 08/14/17 1750)     Initial Impression / Assessment and Plan / UC Course  I have reviewed the triage vital signs and the nursing notes.  Pertinent labs & imaging results that were available during my care of the patient were reviewed by me and considered in my medical decision making (see chart for details).   Your xray was negative for fracture, small Rest elevate left knee/leg, take Ibuprofen as directed for pain. Return to UC as needed.   Final Clinical Impressions(s) / UC Diagnoses   Final diagnoses:  Acute pain of left knee  Effusion of left  knee    New Prescriptions Discharge Medication List as of 08/14/2017  5:45 PM    START taking these medications   Details  ibuprofen (ADVIL,MOTRIN) 600 MG tablet Take 1 tablet (600 mg total) by mouth every 8 (eight) hours as needed., Starting Wed 08/14/2017, Normal  Controlled Substance Prescriptions Peterson Lombard, NP 22/56/72 1904

## 2017-08-14 NOTE — Discharge Instructions (Signed)
Your xray was negative for small amount of fluid, no fracture.Rest elevate left knee/leg, take Ibuprofen as directed for pain. Return to UC as needed. Follow up with your PCP if pain worsens, may need referral to Ortho for further evaluation.

## 2017-08-27 ENCOUNTER — Ambulatory Visit: Payer: Self-pay

## 2017-08-27 NOTE — Telephone Encounter (Signed)
Appt. Made for new pt. Appt. On 08/30/17 @ [facility].  Pt. Agreed.   Reason for Disposition . [1] MODERATE pain (e.g., interferes with normal activities, limping) AND [2] present > 3 days  Answer Assessment - Initial Assessment Questions 1. ONSET: "When did the pain start?"      Started about early October 2. LOCATION: "Where is the pain located?"      Left leg behind the knee and in the knee cap. 3. PAIN: "How bad is the pain?"    (Scale 1-10; or mild, moderate, severe)   -  MILD (1-3): doesn't interfere with normal activities    -  MODERATE (4-7): interferes with normal activities (e.g., work or school) or awakens from sleep, limping    -  SEVERE (8-10): excruciating pain, unable to do any normal activities, unable to walk     It hurts to put pressure on the left leg; the stiffness and pain comes and goes.  Rates pain at 5-6/10.   4. WORK OR EXERCISE: "Has there been any recent work or exercise that involved this part of the body?"      no 5. CAUSE: "What do you think is causing the leg pain?"     Reported she fell about 4-5 months ago, and landed on concrete on both of her knees. 6. OTHER SYMPTOMS: "Do you have any other symptoms?" (e.g., chest pain, back pain, breathing difficulty, swelling, rash, fever, numbness, weakness)     Stated she had been seen by Urgent Care 10/10, and was xrayed, and was told she had fluid in her knee.  Was told to elevate, but c/o increased stiffness when elevating, and increased stiffness when she gets up in the morning. 7. PREGNANCY: "Is there any chance you are pregnant?" "When was your last menstrual period?"     no  Protocols used: LEG PAIN-A-AH

## 2017-08-30 ENCOUNTER — Ambulatory Visit: Payer: Self-pay | Admitting: Family Medicine

## 2017-11-19 ENCOUNTER — Telehealth: Payer: Self-pay | Admitting: Gastroenterology

## 2017-11-19 ENCOUNTER — Other Ambulatory Visit: Payer: Self-pay

## 2017-11-19 DIAGNOSIS — Z1211 Encounter for screening for malignant neoplasm of colon: Secondary | ICD-10-CM

## 2017-11-19 NOTE — Telephone Encounter (Signed)
Patient returned your call.

## 2017-11-19 NOTE — Telephone Encounter (Signed)
Patient Tina Clark and is ready to schedule colonoscopy.

## 2017-11-19 NOTE — Progress Notes (Signed)
Gastroenterology Pre-Procedure Review  Request Date: Friday, 2/1 Requesting Physician: Dr. Allen Norris  PATIENT REVIEW QUESTIONS: The patient responded to the following health history questions as indicated:    1. Are you having any GI issues? no 2. Do you have a personal history of Polyps? no 3. Do you have a family history of Colon Cancer or Polyps? no 4. Diabetes Mellitus? no 5. Joint replacements in the past 12 months?no 6. Major health problems in the past 3 months?no 7. Any artificial heart valves, MVP, or defibrillator?no    MEDICATIONS & ALLERGIES:    Patient reports the following regarding taking any anticoagulation/antiplatelet therapy:   Plavix, Coumadin, Eliquis, Xarelto, Lovenox, Pradaxa, Brilinta, or Effient? no Aspirin? no  Patient confirms/reports the following medications:  Current Outpatient Medications  Medication Sig Dispense Refill  . benztropine (COGENTIN) 2 MG tablet Take 2 mg by mouth 2 (two) times daily.    . haloperidol (HALDOL) 5 MG tablet Take 5 mg by mouth 2 (two) times daily.    . haloperidol decanoate (HALDOL DECANOATE) 50 MG/ML injection Inject 180 mg into the muscle every 28 (twenty-eight) days.     Marland Kitchen ibuprofen (ADVIL,MOTRIN) 600 MG tablet Take 1 tablet (600 mg total) by mouth every 8 (eight) hours as needed. 10 tablet 0   No current facility-administered medications for this visit.     Patient confirms/reports the following allergies:  No Known Allergies  No orders of the defined types were placed in this encounter.   AUTHORIZATION INFORMATION Primary Insurance: 1D#: Group #:  Secondary Insurance: 1D#: Group #:  SCHEDULE INFORMATION: Date: 2/1 Time: Location: [place]

## 2017-12-02 ENCOUNTER — Telehealth: Payer: Self-pay | Admitting: Gastroenterology

## 2017-12-02 ENCOUNTER — Encounter: Payer: Self-pay | Admitting: *Deleted

## 2017-12-02 ENCOUNTER — Other Ambulatory Visit: Payer: Self-pay

## 2017-12-02 NOTE — Telephone Encounter (Signed)
Patient has been advised that she can still have her colonoscopy even if she has been experiencing constipation.  I asked her to make sure she drinks plenty of water 8 oz every hour and the bowel prep should empty out any stool that she has not been able to eliminate. Asked her to call back if she has any additional concerns.

## 2017-12-02 NOTE — Telephone Encounter (Signed)
Please call about diet for colonoscopy on 12-06-17 with Dr Allen Norris.

## 2017-12-02 NOTE — Telephone Encounter (Signed)
PLEASE CALL PATIENT SHE IS TAKING MEDICATION & WANTED TO MAKE SURE SHE COULD HAVE HER COLONOSCOPY SCHEDULED FOR Friday 12-06-17 WITH DR [facility].

## 2017-12-03 NOTE — Telephone Encounter (Signed)
Duplicate message. Pt stated Tina Clark explained everything to her yesterday and she was good today.

## 2017-12-03 NOTE — Discharge Instructions (Signed)
General Anesthesia, Adult, Care After °These instructions provide you with information about caring for yourself after your procedure. Your health care provider may also give you more specific instructions. Your treatment has been planned according to current medical practices, but problems sometimes occur. Call your health care provider if you have any problems or questions after your procedure. °What can I expect after the procedure? °After the procedure, it is common to have: °· Vomiting. °· A sore throat. °· Mental slowness. ° °It is common to feel: °· Nauseous. °· Cold or shivery. °· Sleepy. °· Tired. °· Sore or achy, even in parts of your body where you did not have surgery. ° °Follow these instructions at home: °For at least 24 hours after the procedure: °· Do not: °? Participate in activities where you could fall or become injured. °? Drive. °? Use heavy machinery. °? Drink alcohol. °? Take sleeping pills or medicines that cause drowsiness. °? Make important decisions or sign legal documents. °? Take care of children on your own. °· Rest. °Eating and drinking °· If you vomit, drink water, juice, or soup when you can drink without vomiting. °· Drink enough fluid to keep your urine clear or pale yellow. °· Make sure you have little or no nausea before eating solid foods. °· Follow the diet recommended by your health care provider. °General instructions °· Have a responsible adult stay with you until you are awake and alert. °· Return to your normal activities as told by your health care provider. Ask your health care provider what activities are safe for you. °· Take over-the-counter and prescription medicines only as told by your health care provider. °· If you smoke, do not smoke without supervision. °· Keep all follow-up visits as told by your health care provider. This is important. °Contact a health care provider if: °· You continue to have nausea or vomiting at home, and medicines are not helpful. °· You  cannot drink fluids or start eating again. °· You cannot urinate after 8-12 hours. °· You develop a skin rash. °· You have fever. °· You have increasing redness at the site of your procedure. °Get help right away if: °· You have difficulty breathing. °· You have chest pain. °· You have unexpected bleeding. °· You feel that you are having a life-threatening or urgent problem. °This information is not intended to replace advice given to you by your health care provider. Make sure you discuss any questions you have with your health care provider. °Document Released: 01/28/2001 Document Revised: 03/26/2016 Document Reviewed: 10/06/2015 °Elsevier Interactive Patient Education © 2018 [employer] Inc[employer] ° °

## 2017-12-06 ENCOUNTER — Ambulatory Visit
Admission: RE | Admit: 2017-12-06 | Discharge: 2017-12-06 | Disposition: A | Discharge status: Home / Self Care | Payer: Non-veteran care | Source: Ambulatory Visit | Attending: Gastroenterology | Admitting: Gastroenterology

## 2017-12-06 ENCOUNTER — Ambulatory Visit: Payer: Non-veteran care | Admitting: Anesthesiology

## 2017-12-06 ENCOUNTER — Telehealth: Payer: Self-pay | Admitting: Gastroenterology

## 2017-12-06 ENCOUNTER — Encounter
Admission: RE | Disposition: A | Discharge status: Home / Self Care | Payer: Self-pay | Source: Ambulatory Visit | Attending: Gastroenterology

## 2017-12-06 DIAGNOSIS — K635 Polyp of colon: Secondary | ICD-10-CM | POA: Insufficient documentation

## 2017-12-06 DIAGNOSIS — F209 Schizophrenia, unspecified: Secondary | ICD-10-CM | POA: Insufficient documentation

## 2017-12-06 DIAGNOSIS — F79 Unspecified intellectual disabilities: Secondary | ICD-10-CM | POA: Insufficient documentation

## 2017-12-06 DIAGNOSIS — F319 Bipolar disorder, unspecified: Secondary | ICD-10-CM | POA: Insufficient documentation

## 2017-12-06 DIAGNOSIS — K641 Second degree hemorrhoids: Secondary | ICD-10-CM | POA: Diagnosis not present

## 2017-12-06 DIAGNOSIS — Z79899 Other long term (current) drug therapy: Secondary | ICD-10-CM | POA: Diagnosis not present

## 2017-12-06 DIAGNOSIS — D125 Benign neoplasm of sigmoid colon: Secondary | ICD-10-CM | POA: Diagnosis not present

## 2017-12-06 DIAGNOSIS — Z1211 Encounter for screening for malignant neoplasm of colon: Secondary | ICD-10-CM | POA: Insufficient documentation

## 2017-12-06 DIAGNOSIS — D122 Benign neoplasm of ascending colon: Secondary | ICD-10-CM | POA: Insufficient documentation

## 2017-12-06 HISTORY — DX: Anemia, unspecified: D64.9

## 2017-12-06 HISTORY — PX: COLONOSCOPY WITH PROPOFOL: SHX5780

## 2017-12-06 HISTORY — DX: Bipolar disorder, unspecified: F31.9

## 2017-12-06 HISTORY — PX: POLYPECTOMY: SHX5525

## 2017-12-06 HISTORY — DX: Schizophrenia, unspecified: F20.9

## 2017-12-06 SURGERY — COLONOSCOPY WITH PROPOFOL
Anesthesia: General | Wound class: Contaminated

## 2017-12-06 MED ORDER — LACTATED RINGERS IV SOLN: INTRAVENOUS | Status: DC

## 2017-12-06 MED ORDER — LACTATED RINGERS IV SOLN
INTRAVENOUS | Status: DC
  Administered 2017-12-06: 12:00:00 via INTRAVENOUS

## 2017-12-06 MED ORDER — STERILE WATER FOR IRRIGATION IR SOLN
Status: DC | PRN
  Administered 2017-12-06: 12:00:00

## 2017-12-06 MED ORDER — OXYCODONE HCL 5 MG/5ML PO SOLN: 5.0000 mg | Freq: Once | ORAL | Status: DC | PRN

## 2017-12-06 MED ORDER — LIDOCAINE HCL (CARDIAC) 20 MG/ML IV SOLN
INTRAVENOUS | Status: DC | PRN
  Administered 2017-12-06: 40 mg via INTRAVENOUS

## 2017-12-06 MED ORDER — PROPOFOL 10 MG/ML IV BOLUS
INTRAVENOUS | Status: DC | PRN
  Administered 2017-12-06: 30 mg via INTRAVENOUS
  Administered 2017-12-06: 20 mg via INTRAVENOUS
  Administered 2017-12-06: 100 mg via INTRAVENOUS
  Administered 2017-12-06: 20 mg via INTRAVENOUS
  Administered 2017-12-06: 30 mg via INTRAVENOUS
  Administered 2017-12-06: 50 mg via INTRAVENOUS
  Administered 2017-12-06: 30 mg via INTRAVENOUS
  Administered 2017-12-06 (×3): 20 mg via INTRAVENOUS
  Administered 2017-12-06: 30 mg via INTRAVENOUS
  Administered 2017-12-06: 20 mg via INTRAVENOUS
  Administered 2017-12-06 (×2): 30 mg via INTRAVENOUS

## 2017-12-06 MED ORDER — OXYCODONE HCL 5 MG PO TABS: 5.0000 mg | ORAL_TABLET | Freq: Once | ORAL | Status: DC | PRN

## 2017-12-06 SURGICAL SUPPLY — 24 items
CANISTER SUCT 1200ML W/VALVE (MISCELLANEOUS) ×4 IMPLANT
CLIP HMST 235XBRD CATH ROT (MISCELLANEOUS) IMPLANT
CLIP RESOLUTION 360 11X235 (MISCELLANEOUS)
ELECT REM PT RETURN 9FT ADLT (ELECTROSURGICAL)
ELECTRODE REM PT RTRN 9FT ADLT (ELECTROSURGICAL) IMPLANT
FCP ESCP3.2XJMB 240X2.8X (MISCELLANEOUS)
FORCEPS BIOP RAD 4 LRG CAP 4 (CUTTING FORCEPS) IMPLANT
FORCEPS BIOP RJ4 240 W/NDL (MISCELLANEOUS)
FORCEPS ESCP3.2XJMB 240X2.8X (MISCELLANEOUS) IMPLANT
GOWN CVR UNV OPN BCK APRN NK (MISCELLANEOUS) ×4 IMPLANT
GOWN ISOL THUMB LOOP REG UNIV (MISCELLANEOUS) ×4
INJECTOR VARIJECT VIN23 (MISCELLANEOUS) IMPLANT
KIT DEFENDO VALVE AND CONN (KITS) IMPLANT
KIT ENDO PROCEDURE OLY (KITS) ×4 IMPLANT
MARKER SPOT ENDO TATTOO 5ML (MISCELLANEOUS) IMPLANT
PROBE APC STR FIRE (PROBE) IMPLANT
RETRIEVER NET ROTH 2.5X230 LF (MISCELLANEOUS) IMPLANT
SNARE SHORT THROW 13M SML OVAL (MISCELLANEOUS) ×4 IMPLANT
SNARE SHORT THROW 30M LRG OVAL (MISCELLANEOUS) IMPLANT
SNARE SNG USE RND 15MM (INSTRUMENTS) IMPLANT
SPOT EX ENDOSCOPIC TATTOO (MISCELLANEOUS)
TRAP ETRAP POLY (MISCELLANEOUS) ×4 IMPLANT
VARIJECT INJECTOR VIN23 (MISCELLANEOUS)
WATER STERILE IRR 250ML POUR (IV SOLUTION) ×4 IMPLANT

## 2017-12-06 NOTE — Anesthesia Procedure Notes (Signed)
Procedure Name: MAC Date/Time: 12/06/2017 11:59 AM Performed by: Janna Arch, CRNA Pre-anesthesia Checklist: Patient identified, Emergency Drugs available, Suction available and Patient being monitored Patient Re-evaluated:Patient Re-evaluated prior to induction Oxygen Delivery Method: Nasal cannula

## 2017-12-06 NOTE — H&P (Signed)
Lucilla Lame, MD [address] [place], [place] [address] Phone: [phone] Fax : [phone]  Primary Care Physician:  Elba Barman, NP Primary Gastroenterologist:  Dr. Allen Norris  Pre-Procedure History & Physical: HPI:  Tina Clark is a 52 y.o. female is here for a screening colonoscopy.   Past Medical History:  Diagnosis Date  . Anemia   . Bipolar disorder ([place])   . Mental impairment   . Schizophrenia, unspecified ([place])     Past Surgical History:  Procedure Laterality Date  . NO PAST SURGERIES      Prior to Admission medications   Medication Sig Start Date End Date Taking? Authorizing Provider  benztropine (COGENTIN) 2 MG tablet Take 2 mg by mouth 2 (two) times daily.   Yes [provider]  Cholecalciferol (VITAMIN D-3) 1000 units CAPS Take 2,000 Units by mouth daily.   Yes [provider]  CRANBERRY PO Take by mouth daily.   Yes [provider]  haloperidol (HALDOL) 5 MG tablet Take 5 mg by mouth 2 (two) times daily.   Yes [provider]  haloperidol decanoate (HALDOL DECANOATE) 50 MG/ML injection Inject 180 mg into the muscle every 28 (twenty-eight) days.    Yes [provider]  Misc Natural Products (CRANBERRY/PROBIOTIC PO) Take by mouth daily.   Yes [provider]  Probiotic Product ([place]) Take by mouth daily.   Yes [provider]  ibuprofen (ADVIL,MOTRIN) 600 MG tablet Take 1 tablet (600 mg total) by mouth every 8 (eight) hours as needed. Patient not taking: Reported on 03/23/8415 60/63/01   Tori Milks, NP    Allergies as of 11/19/2017  . (No Known Allergies)    History reviewed. No pertinent family history.  Social History   Socioeconomic History  . Marital status: Single    Spouse name: Not on file  . Number of children: Not on file  . Years of education: Not on file  . Highest education level: Not on file  Social Needs  .  Financial resource strain: Not on file  . Food insecurity - worry: Not on file  . Food insecurity - inability: Not on file  . Transportation needs - medical: Not on file  . Transportation needs - non-medical: Not on file  Occupational History  . Not on file  Tobacco Use  . Smoking status: Never Smoker  . Smokeless tobacco: Never Used  Substance and Sexual Activity  . Alcohol use: No  . Drug use: No  . Sexual activity: Not on file  Other Topics Concern  . Not on file  Social History Narrative  . Not on file    Review of Systems: See HPI, otherwise negative ROS  Physical Exam: Ht 6' (1.829 m)   Wt 198 lb (89.8 kg)   LMP 01/04/2015 Comment: medicine she is on makes her not have periods. denies preg  BMI 26.85 kg/m  General:   Alert,  pleasant and cooperative in NAD Head:  Normocephalic and atraumatic. Neck:  Supple; no masses or thyromegaly. Lungs:  Clear throughout to auscultation.    Heart:  Regular rate and rhythm. Abdomen:  Soft, nontender and nondistended. Normal bowel sounds, without guarding, and without rebound.   Neurologic:  Alert and  oriented x4;  grossly normal neurologically.  Impression/Plan: Tina Clark is now here to undergo a screening colonoscopy.  Risks, benefits, and alternatives regarding colonoscopy have been reviewed with the patient.  Questions have been answered.  All  parties agreeable.

## 2017-12-06 NOTE — Anesthesia Preprocedure Evaluation (Signed)
Anesthesia Evaluation  Patient identified by MRN, date of birth, ID band Patient awake    Reviewed: Allergy & Precautions, H&P , NPO status , Patient's Chart, lab work & pertinent test results  Airway Mallampati: II  TM Distance: >3 FB Neck ROM: full    Dental no notable dental hx.    Pulmonary neg pulmonary ROS,    Pulmonary exam normal        Cardiovascular negative cardio ROS Normal cardiovascular exam     Neuro/Psych    GI/Hepatic negative GI ROS, Neg liver ROS,   Endo/Other  negative endocrine ROS  Renal/GU negative Renal ROS     Musculoskeletal   Abdominal   Peds  Hematology negative hematology ROS (+)   Anesthesia Other Findings   Reproductive/Obstetrics negative OB ROS                             Anesthesia Physical Anesthesia Plan  ASA: II  Anesthesia Plan: General   Post-op Pain Management:    Induction:   PONV Risk Score and Plan:   Airway Management Planned:   Additional Equipment:   Intra-op Plan:   Post-operative Plan:   Informed Consent: I have reviewed the patients History and Physical, chart, labs and discussed the procedure including the risks, benefits and alternatives for the proposed anesthesia with the patient or authorized representative who has indicated his/her understanding and acceptance.     Plan Discussed with:   Anesthesia Plan Comments:         Anesthesia Quick Evaluation

## 2017-12-06 NOTE — Transfer of Care (Signed)
Immediate Anesthesia Transfer of Care Note  Patient: Tina Clark  Procedure(s) Performed: COLONOSCOPY WITH PROPOFOL (N/A ) POLYPECTOMY  Patient Location: PACU  Anesthesia Type: General  Level of Consciousness: awake, alert  and patient cooperative  Airway and Oxygen Therapy: Patient Spontanous Breathing and Patient connected to supplemental oxygen  Post-op Assessment: Post-op Vital signs reviewed, Patient's Cardiovascular Status Stable, Respiratory Function Stable, Patent Airway and No signs of Nausea or vomiting  Post-op Vital Signs: Reviewed and stable  Complications: No apparent anesthesia complications

## 2017-12-06 NOTE — Anesthesia Postprocedure Evaluation (Signed)
Anesthesia Post Note  Patient: Tina Clark  Procedure(s) Performed: COLONOSCOPY WITH PROPOFOL (N/A ) POLYPECTOMY  Patient location during evaluation: PACU Anesthesia Type: General Level of consciousness: awake and alert Pain management: pain level controlled Vital Signs Assessment: post-procedure vital signs reviewed and stable Respiratory status: spontaneous breathing Cardiovascular status: blood pressure returned to baseline Postop Assessment: no headache Anesthetic complications: no    Jaci Standard, III,  Sussan Meter D

## 2017-12-06 NOTE — Telephone Encounter (Signed)
Patient LVM and has some questions post procedure.

## 2017-12-06 NOTE — Telephone Encounter (Signed)
Pt had some questions regarding her procedure results and hemorrhoids. Discussed with pt symptoms of hemorrhoids. Pt stated she was not having any problems with hemorrhoids. She just wanted to know what Grade 2 was.

## 2017-12-06 NOTE — Op Note (Signed)
[facility] Gastroenterology Patient Name: Tina Clark Procedure Date: 12/06/2017 11:58 AM MRN: [id number] Account #: [id number] Date of Birth: August 31, 1966 Admit Type: Outpatient Age: 52 Room: [facility] OR ROOM 01 Gender: Female Note Status: Finalized Procedure:            Colonoscopy Indications:          Screening for colorectal malignant neoplasm Providers:            Lucilla Lame MD, MD Medicines:            Propofol per Anesthesia Complications:        No immediate complications. Procedure:            Pre-Anesthesia Assessment:                       - Prior to the procedure, a History and Physical was                        performed, and patient medications and allergies were                        reviewed. The patient's tolerance of previous                        anesthesia was also reviewed. The risks and benefits of                        the procedure and the sedation options and risks were                        discussed with the patient. All questions were                        answered, and informed consent was obtained. Prior                        Anticoagulants: The patient has taken no previous                        anticoagulant or antiplatelet agents. ASA Grade                        Assessment: II - A patient with mild systemic disease.                        After reviewing the risks and benefits, the patient was                        deemed in satisfactory condition to undergo the                        procedure.                       After obtaining informed consent, the colonoscope was                        passed under direct vision. Throughout the procedure,                        the patient's blood pressure,  pulse, and oxygen                        saturations were monitored continuously. The [place] (S#: [id number]) was introduced through                        the anus and advanced to the the  cecum, identified by                        appendiceal orifice and ileocecal valve. The                        colonoscopy was performed without difficulty. The                        patient tolerated the procedure well. The quality of                        the bowel preparation was excellent. Findings:      The perianal and digital rectal examinations were normal.      A 4 mm polyp was found in the ascending colon. The polyp was sessile.       The polyp was removed with a cold snare. Resection and retrieval were       complete.      Non-bleeding internal hemorrhoids were found during retroflexion. The       hemorrhoids were Grade II (internal hemorrhoids that prolapse but reduce       spontaneously).      A 2 mm polyp was found in the sigmoid colon. The polyp was sessile. The       polyp was removed with a cold biopsy forceps. Resection and retrieval       were complete. Impression:           - One 4 mm polyp in the ascending colon, removed with a                        cold snare. Resected and retrieved.                       - Non-bleeding internal hemorrhoids.                       - One 2 mm polyp in the sigmoid colon, removed with a                        cold biopsy forceps. Resected and retrieved. Recommendation:       - Discharge patient to home.                       - Resume previous diet.                       - Continue present medications.                       - Await pathology results. Procedure Code(s):    --- Professional ---  45385, Colonoscopy, flexible; with removal of tumor(s),                        polyp(s), or other lesion(s) by snare technique                       45380, 59, Colonoscopy, flexible; with biopsy, single                        or multiple Diagnosis Code(s):    --- Professional ---                       Z12.11, Encounter for screening for malignant neoplasm                        of colon                       D12.2,  Benign neoplasm of ascending colon                       D12.5, Benign neoplasm of sigmoid colon CPT copyright 2016 American Medical Association. All rights reserved. The codes documented in this report are preliminary and upon coder review may  be revised to meet current compliance requirements. Lucilla Lame MD, MD 12/06/2017 12:29:36 PM This report has been signed electronically. Number of Addenda: 0 Note Initiated On: 12/06/2017 11:58 AM Scope Withdrawal Time: 0 hours 7 minutes 24 seconds  Total Procedure Duration: 0 hours 22 minutes 12 seconds       [facility]

## 2017-12-10 ENCOUNTER — Encounter: Payer: Self-pay | Admitting: Gastroenterology

## 2017-12-11 ENCOUNTER — Encounter: Payer: Self-pay | Admitting: Gastroenterology

## 2018-05-17 IMAGING — CR DG KNEE COMPLETE 4+V*L*
5 series · 5 of 5 positions shown · non-contrast
Comparison: 03/16/2017

CLINICAL DATA: Left leg pain for 4 days.

EXAM:
LEFT KNEE - COMPLETE 4+ VIEW

[knee ap]
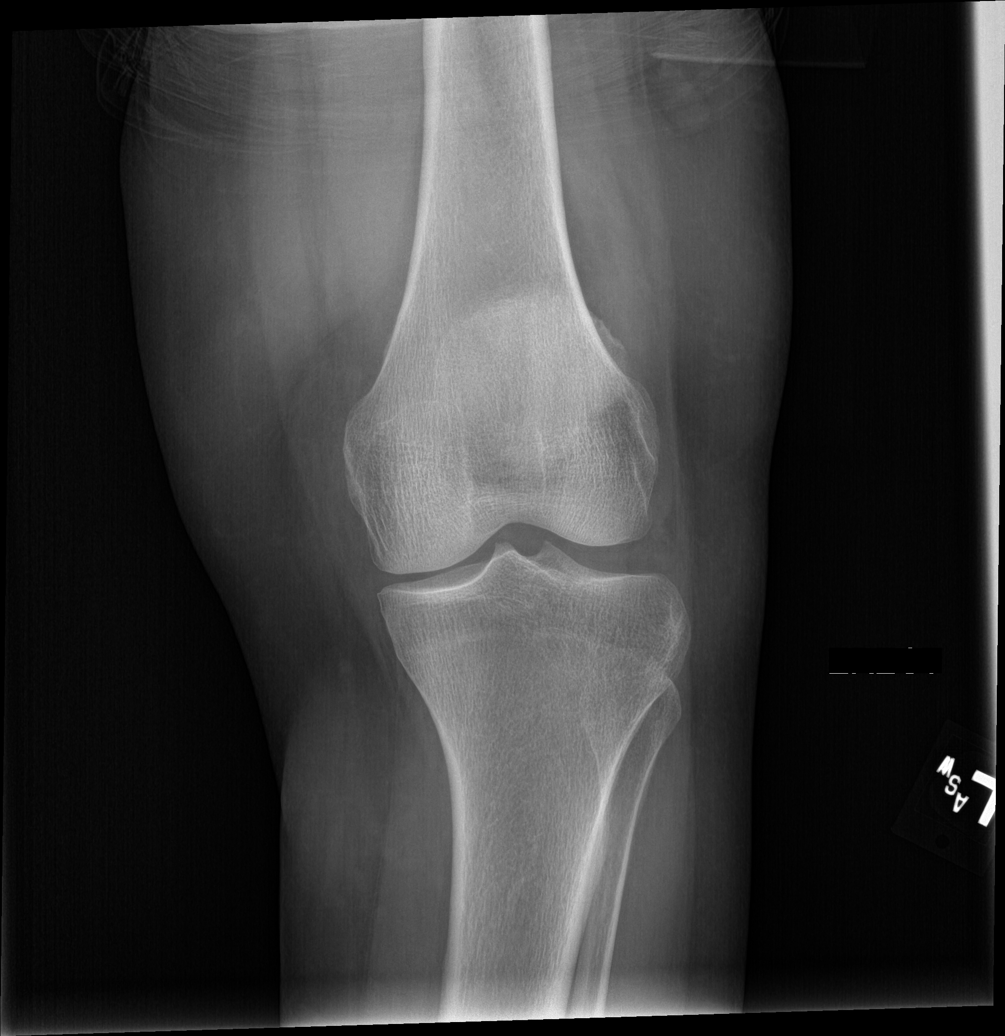

[knee lat (1 of 2)]
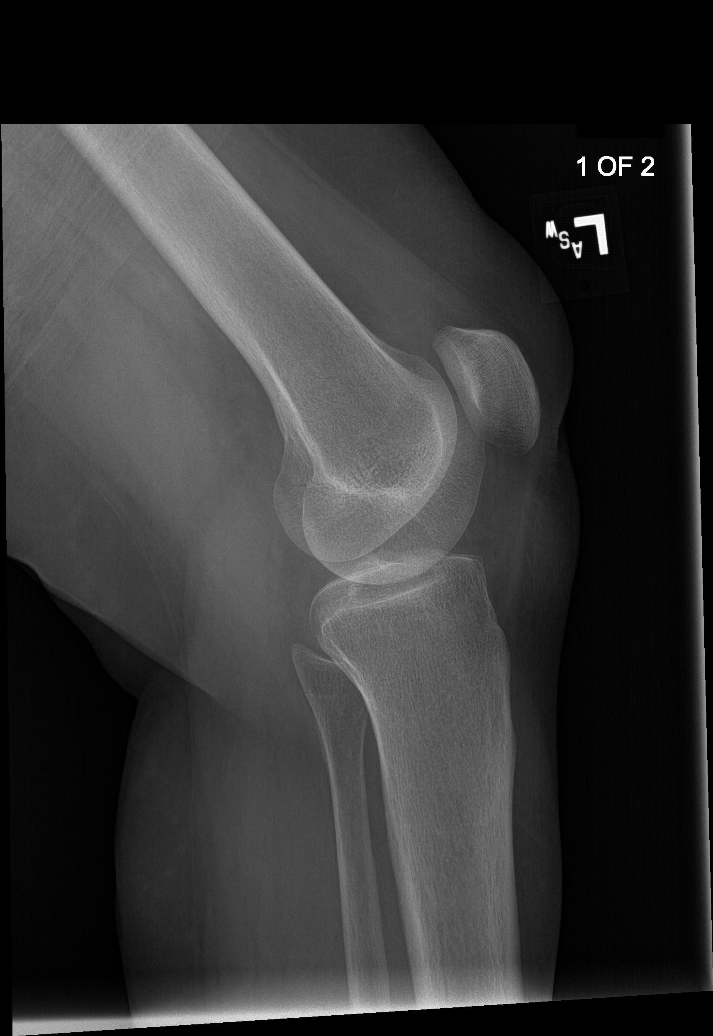

[tunnel]
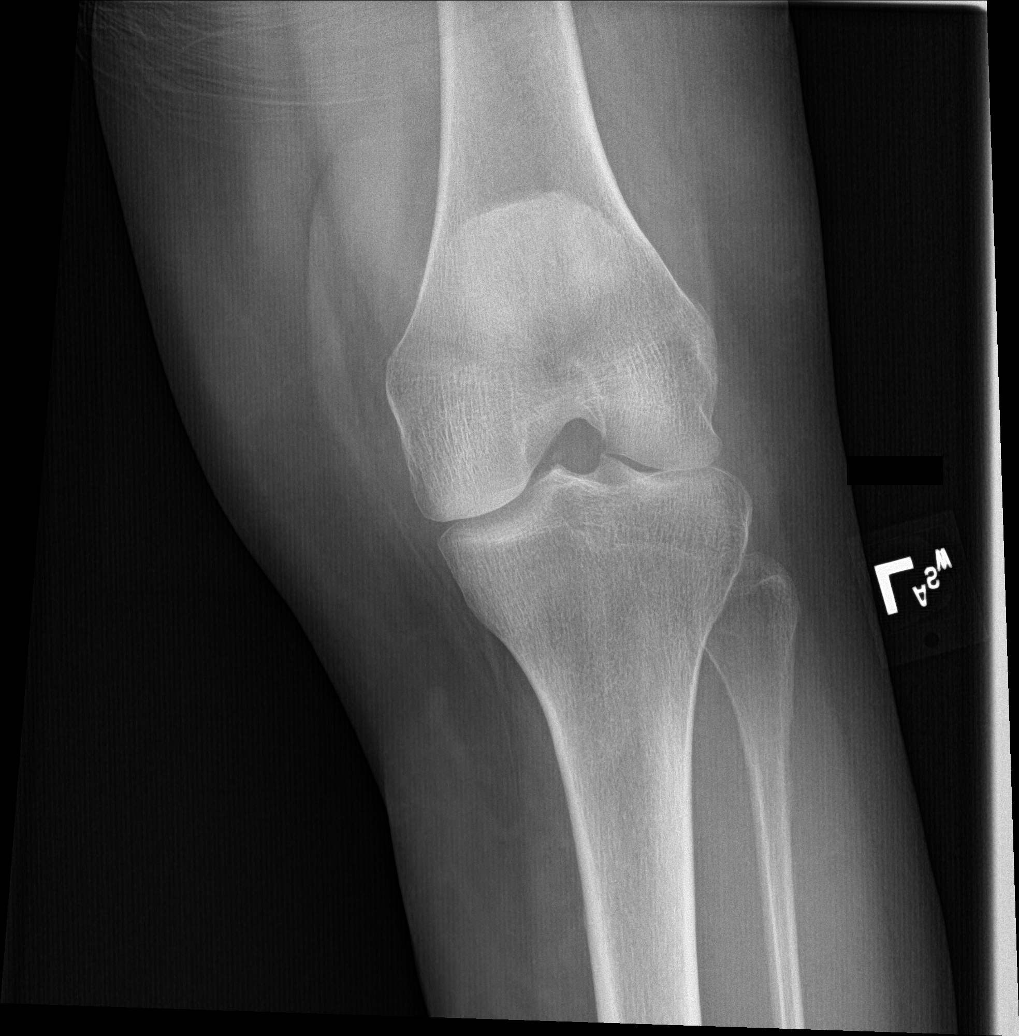

[patella skyline]
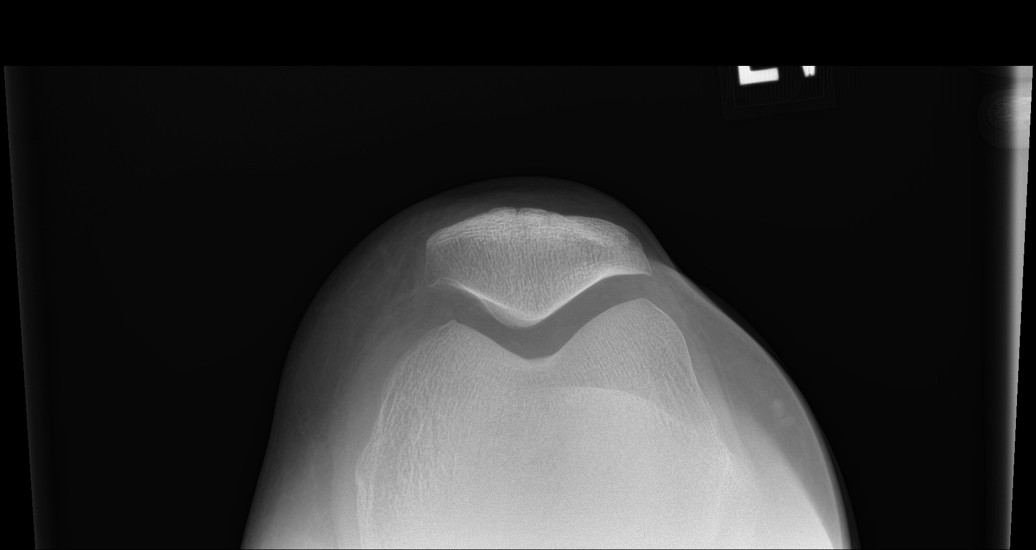

[knee lat (2 of 2)]
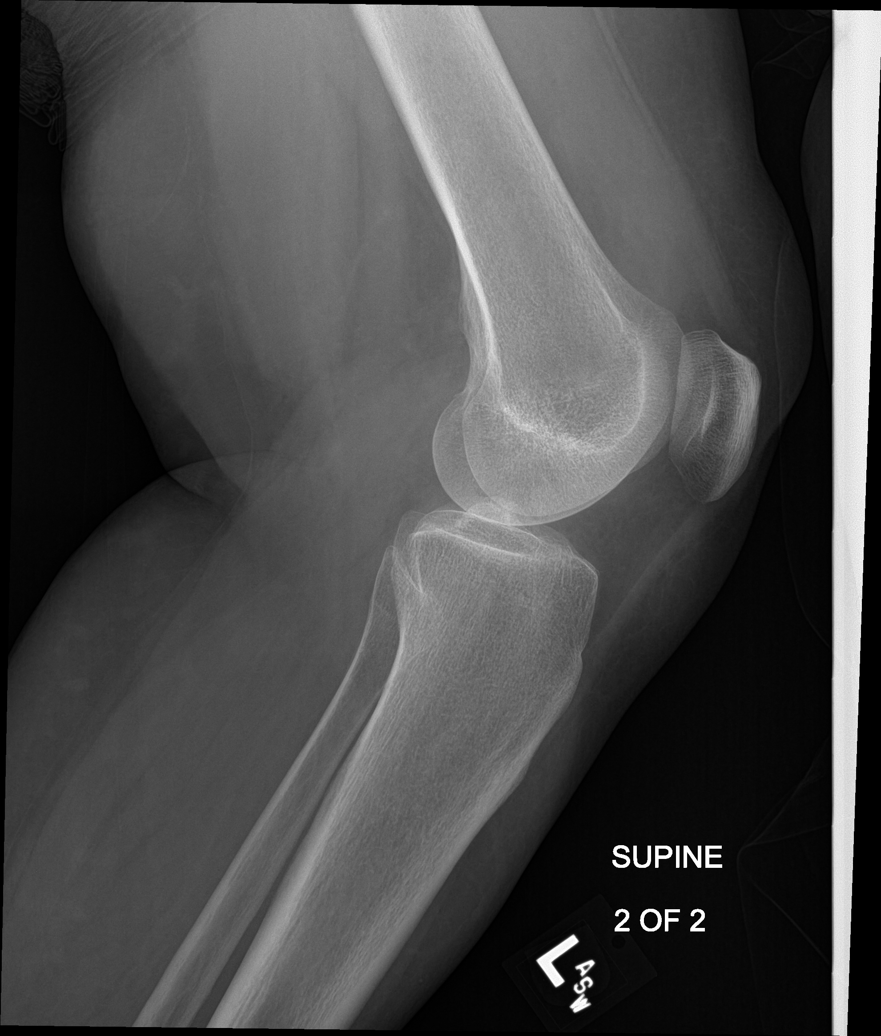

[5 of 5 positions shown; findings below may reference images not displayed]

FINDINGS: No acute bony abnormality. Specifically, no fracture, subluxation,
or dislocation. Soft tissues are intact. Small joint effusion
IMPRESSION: No acute bony abnormality.  Small joint effusion.

## 2019-02-04 DIAGNOSIS — Z8659 Personal history of other mental and behavioral disorders: Secondary | ICD-10-CM | POA: Insufficient documentation

## 2019-02-04 DIAGNOSIS — F319 Bipolar disorder, unspecified: Secondary | ICD-10-CM | POA: Insufficient documentation

## 2019-05-07 ENCOUNTER — Other Ambulatory Visit: Payer: Self-pay

## 2019-05-07 ENCOUNTER — Encounter: Payer: Self-pay | Admitting: Primary Care

## 2019-05-07 ENCOUNTER — Ambulatory Visit (INDEPENDENT_AMBULATORY_CARE_PROVIDER_SITE_OTHER): Payer: Medicare Other | Admitting: Primary Care

## 2019-05-07 VITALS — BP 100/70 | HR 66 | Temp 97.8°F | Ht 71.0 in | Wt 192.0 lb

## 2019-05-07 DIAGNOSIS — D122 Benign neoplasm of ascending colon: Secondary | ICD-10-CM

## 2019-05-07 DIAGNOSIS — L299 Pruritus, unspecified: Secondary | ICD-10-CM | POA: Diagnosis not present

## 2019-05-07 LAB — BASIC METABOLIC PANEL
BUN: 8 mg/dL (ref 6–23)
CO2: 28 mEq/L (ref 19–32)
Calcium: 9.1 mg/dL (ref 8.4–10.5)
Chloride: 108 mEq/L (ref 96–112)
Creatinine, Ser: 0.87 mg/dL (ref 0.40–1.20)
GFR: 82.62 mL/min (ref >60.00)
Glucose, Bld: 88 mg/dL (ref 70–99)
Potassium: 4 mEq/L (ref 3.5–5.1)
Sodium: 143 mEq/L (ref 135–145)

## 2019-05-07 LAB — CBC
HCT: 40 % (ref 36.0–46.0)
Hemoglobin: 13.1 g/dL (ref 12.0–15.0)
MCHC: 32.7 g/dL (ref 30.0–36.0)
MCV: 92.7 fl (ref 78.0–100.0)
Platelets: 181 10*3/uL (ref 150.0–400.0)
RBC: 4.32 Mil/uL (ref 3.87–5.11)
RDW: 13.8 % (ref 11.5–15.5)
WBC: 2.9 10*3/uL — ABNORMAL LOW (ref 4.0–10.5)

## 2019-05-07 LAB — HEMOGLOBIN A1C: Hgb A1c MFr Bld: 5.4 % (ref 4.6–6.5)

## 2019-05-07 LAB — TSH: TSH: 0.57 u[IU]/mL (ref 0.35–4.50)

## 2019-05-07 NOTE — Assessment & Plan Note (Signed)
Colonoscopy completed in 12/2017, due again in 2024 based off of pathology report.

## 2019-05-07 NOTE — Progress Notes (Signed)
Subjective:    Patient ID: Tina Clark, female    DOB: [birth year], 53 y.o.   MRN: [id number]  HPI  Tina Clark is a 53 year old female who presents today to establish care and discuss the problems mentioned below. Will obtain/review records. She follows with the [facility] for annual physical, mammogram, etc.    1) Schizophrenia/Bipolar Disorder: Diagnosed years ago. Currently managed on benztropine 2 mg daily and Haldol 175 mg once monthly. She is following with psychiatry through the [facility]. Overall feels well managed. She served in [employer] from [employer]-92.  2) Colonic Polyps: Benign. Last colonoscopy was in 2019 with benign, precancerous polyps. Recommendation is for repeat colonoscopy in 5 years which would be 2024.  3) Peeling Skin: She has noticed itching with a white "film" and peeling to bilateral upper and lower extremities. She's noticed that this occurs with drinking lemonade, certain teas, fruit punch, Snapple, soda, milk. She has no symptoms with drinking water. This began around 3-7 years ago. She doesn't take anything OTC. She applies a natural food extract soap to keep her skin soft.   She can eat fruit without experiencing symptoms but cannot drink juice. She denies wheezing, shortness of breath, throat tightness/closure.  She would like to have some blood work done to evaluate her symptoms. She was evaluated by an "allergist" last year who told her that there was "nothing he could do for me".   Based off of records she established care with [facility] O[facility]ond on 02/04/2019 through [facility], evaluated for same complaint and was told to use lotion to the skin. She did not mention this today during her visit.  Review of Systems  HENT: Negative for trouble swallowing.   Respiratory: Negative for shortness of breath and wheezing.   Cardiovascular: Negative for chest pain.  Skin: Positive for rash.  Hematological: Negative for adenopathy.  Psychiatric/Behavioral:   Feels well managed on current regimen       Past Medical History:  Diagnosis Date  . Anemia   . Bipolar disorder ([place])   . History of colonic polyps   . Mental impairment   . Schizophrenia, unspecified ([place])   . Seasonal allergies   . UTI (urinary tract infection)      Social History   Socioeconomic History  . Marital status: Single    Spouse name: Not on file  . Number of children: Not on file  . Years of education: Not on file  . Highest education level: Not on file  Occupational History  . Not on file  Social Needs  . Financial resource strain: Not on file  . Food insecurity    Worry: Not on file    Inability: Not on file  . Transportation needs    Medical: Not on file    Non-medical: Not on file  Tobacco Use  . Smoking status: Never Smoker  . Smokeless tobacco: Never Used  Substance and Sexual Activity  . Alcohol use: No  . Drug use: No  . Sexual activity: Not on file  Lifestyle  . Physical activity    Days per week: Not on file    Minutes per session: Not on file  . Stress: Not on file  Relationships  . Social Herbalist on phone: Not on file    Gets together: Not on file    Attends religious service: Not on file    Active member of club or organization: Not on file  Attends meetings of clubs or organizations: Not on file    Relationship status: Not on file  . Intimate partner violence    Fear of current or ex partner: Not on file    Emotionally abused: Not on file    Physically abused: Not on file    Forced sexual activity: Not on file  Other Topics Concern  . Not on file  Social History Narrative   Single.   No children.   On disability.     Past Surgical History:  Procedure Laterality Date  . COLONOSCOPY WITH PROPOFOL N/A 12/06/2017   Procedure: COLONOSCOPY WITH PROPOFOL;  Surgeon: Lucilla Lame, MD;  Location: [place];  Service: Endoscopy;  Laterality: N/A;  . POLYPECTOMY  12/06/2017   Procedure: POLYPECTOMY;  Surgeon:  Lucilla Lame, MD;  Location: [place];  Service: Endoscopy;;    History reviewed. No pertinent family history.  Allergies  Allergen Reactions  . Tramadol Nausea Only    Current Outpatient Medications on File Prior to Visit  Medication Sig Dispense Refill  . benztropine (COGENTIN) 2 MG tablet Take 2 mg by mouth 2 (two) times daily.    . Cholecalciferol (VITAMIN D-3) 1000 units CAPS Take 2,000 Units by mouth daily.    Marland Kitchen CRANBERRY PO Take by mouth daily.    . haloperidol decanoate (HALDOL DECANOATE) 50 MG/ML injection Inject 175 mg into the muscle every 28 (twenty-eight) days.     . Misc Natural Products (CRANBERRY/PROBIOTIC PO) Take by mouth daily.    . Probiotic Product ([place]) Take by mouth daily.     No current facility-administered medications on file prior to visit.     BP 100/70   Pulse 66   Temp 97.8 F (36.6 C) (Tympanic)   Ht 5\' 11"  (1.803 m)   Wt 192 lb (87.1 kg)   LMP 01/04/2015 Comment: medicine she is on makes her not have periods. denies preg  SpO2 99%   BMI 26.78 kg/m    Objective:   Physical Exam  Constitutional: She appears well-nourished.  Neck: Neck supple.  Cardiovascular: Normal rate and regular rhythm.  Respiratory: Effort normal and breath sounds normal.  Skin: Skin is warm and dry.  No rash noted to skin. Skin intact without "white film". Appears very moisturized and healthy.   Psychiatric: She has a normal mood and affect.           Assessment & Plan:

## 2019-05-07 NOTE — Assessment & Plan Note (Signed)
Chronic with drinking certain beverages. I did not appreciate any abnormality on exam today, and in fact her skin appeared very healthy and moisturized.  Check labs today including CBC, BMP, A1C, TSH. I would imagine this has been checked by her [facility] provider.   Discussed to avoid consumption of those beverages if they cause rash/itching. Consider daily antihistamine.

## 2019-05-07 NOTE — Patient Instructions (Addendum)
Stop by the lab prior to leaving today. I will notify you of your results once received.   I recommend you avoid products that cause your symptoms. Drink water mostly.   It was a pleasure to meet you today! Please don't hesitate to call or message me with any questions. Welcome to [facility]!

## 2019-05-11 ENCOUNTER — Telehealth: Payer: Self-pay

## 2019-05-11 DIAGNOSIS — D709 Neutropenia, unspecified: Secondary | ICD-10-CM

## 2019-05-11 NOTE — Telephone Encounter (Signed)
Please notify patient that I placed a referral to hematology for evaluation of her blood levels. Someone will be in touch within the next 1-2 weeks.

## 2019-05-11 NOTE — Telephone Encounter (Deleted)
Spoken and notified patient of Kate Clark's comments. Patient verbalized understanding.  

## 2019-05-11 NOTE — Telephone Encounter (Signed)
I spoke with pt; pt was established with care on 05/07/19. Pt wants to know if could get referral to oncologist to ck bone marrow. Pt said discussed with Gentry Fitz NP on 05/07/19. Pt request cb.

## 2019-05-11 NOTE — Telephone Encounter (Signed)
Patient called back, requesting a call in regards to conversation she had earlier today

## 2019-05-11 NOTE — Telephone Encounter (Signed)
Patient said she spoke to Tina Clark earlier and she does want to see a hematologist.

## 2019-05-11 NOTE — Telephone Encounter (Signed)
Copied from [place] [id number]. Topic: General - Inquiry >> May 08, 2019 11:37 AM Reyne Dumas L wrote: Reason for CRM:   Pt states that she needs to talk to someone about checking her bone marrow.

## 2019-05-12 NOTE — Telephone Encounter (Signed)
Spoken and notified patient of Kate Clark's comments. Patient verbalized understanding.  

## 2019-05-20 ENCOUNTER — Other Ambulatory Visit: Payer: Self-pay

## 2019-05-20 ENCOUNTER — Inpatient Hospital Stay: Payer: Medicare Other

## 2019-05-20 ENCOUNTER — Inpatient Hospital Stay: Payer: Medicare Other | Attending: Oncology | Admitting: Oncology

## 2019-05-20 ENCOUNTER — Encounter: Payer: Self-pay | Admitting: Oncology

## 2019-05-20 VITALS — BP 113/74 | HR 60 | Temp 97.2°F | Ht 71.0 in | Wt 193.2 lb

## 2019-05-20 DIAGNOSIS — D72819 Decreased white blood cell count, unspecified: Secondary | ICD-10-CM

## 2019-05-20 DIAGNOSIS — F209 Schizophrenia, unspecified: Secondary | ICD-10-CM | POA: Diagnosis not present

## 2019-05-20 DIAGNOSIS — D696 Thrombocytopenia, unspecified: Secondary | ICD-10-CM

## 2019-05-20 LAB — CBC WITH DIFFERENTIAL/PLATELET
HCT: 41 % (ref 36.0–46.0)
Hemoglobin: 13.2 g/dL (ref 12.0–15.0)
MCH: 30 pg (ref 26.0–34.0)
MCHC: 32.2 g/dL (ref 30.0–36.0)
MCV: 93.2 fL (ref 80.0–100.0)
Platelets: 163 10*3/uL (ref 150–400)
RBC: 4.4 MIL/uL (ref 3.87–5.11)
RDW: 13 % (ref 11.5–15.5)
WBC: 3.2 10*3/uL — ABNORMAL LOW (ref 4.0–10.5)
nRBC: 0 % (ref 0.0–0.2)

## 2019-05-20 LAB — HEPATIC FUNCTION PANEL
ALT: 12 U/L (ref 0–44)
AST: 19 U/L (ref 15–41)
Albumin: 3.8 g/dL (ref 3.5–5.0)
Alkaline Phosphatase: 66 U/L (ref 38–126)
Bilirubin, Direct: 0.1 mg/dL (ref 0.0–0.2)
Indirect Bilirubin: 0.4 mg/dL (ref 0.3–0.9)
Total Bilirubin: 0.5 mg/dL (ref 0.3–1.2)
Total Protein: 8.1 g/dL (ref 6.5–8.1)

## 2019-05-20 LAB — LACTATE DEHYDROGENASE: LDH: 169 U/L (ref 98–192)

## 2019-05-20 LAB — DIFFERENTIAL
Abs Immature Granulocytes: 0.01 10*3/uL (ref 0.00–0.07)
Basophils Absolute: 0 10*3/uL (ref 0.0–0.1)
Basophils Relative: 0 %
Eosinophils Absolute: 0.1 10*3/uL (ref 0.0–0.5)
Eosinophils Relative: 2 %
Immature Granulocytes: 0 %
Lymphocytes Relative: 32 %
Lymphs Abs: 1 10*3/uL (ref 0.7–4.0)
Monocytes Absolute: 0.3 10*3/uL (ref 0.1–1.0)
Monocytes Relative: 10 %
Neutro Abs: 1.7 10*3/uL (ref 1.7–7.7)
Neutrophils Relative %: 56 %

## 2019-05-20 LAB — TECHNOLOGIST SMEAR REVIEW: Plt Morphology: ADEQUATE

## 2019-05-20 LAB — VITAMIN B12: Vitamin B-12: 271 pg/mL (ref 180–914)

## 2019-05-20 LAB — FOLATE: Folate: 7.7 ng/mL (ref >5.9)

## 2019-05-20 NOTE — Progress Notes (Signed)
Patient here today for new consult for Neurtopenia.

## 2019-05-20 NOTE — Progress Notes (Signed)
Hematology/Oncology Consult note [facility] Telephone:([phone] Fax:([phone]   Patient Care Team: Pleas Koch, NP as PCP - General (Internal Medicine)  REFERRING PROVIDER: Pleas Koch, NP  CHIEF COMPLAINTS/REASON FOR VISIT:  Evaluation of leukopenia  HISTORY OF PRESENTING ILLNESS:  Tina Clark is a 53 y.o. female who was seen in consultation at the request of Pleas Koch, NP for evaluation of leukopenia. Reviewed patient's recent labs. Patient has low total WBC count was 3.2, Previous lab records at [facility] system was reviewed via care everywhere.. Leukopenia duration is chronic onset, duration is since at least 2015.  Predominantly lymphocytopenia. No aggravating or improving factors.   Associated symptoms:  denies fatigue, weight loss, fever, chills, frequent infection.  History hepatitis or HIV infection: Denies History of chronic liver disease: Denies History of blood transfusion: Denies Alcohol consumption: Denies Diet Vegetarian or Vegan: Denies Herbal medication: St. John's wort.  No other herbal supplementation.  Reports that certain food will cause her to have white spots on the skin.  Currently she does not have any skin rash. History of schizophrenia, on Cogentin and Haldol. Review of Systems  Constitutional: Negative for appetite change, chills, fatigue and fever.  HENT:   Negative for hearing loss and voice change.   Eyes: Negative for eye problems.  Respiratory: Negative for chest tightness and cough.   Cardiovascular: Negative for chest pain.  Gastrointestinal: Negative for abdominal distention, abdominal pain and blood in stool.  Endocrine: Negative for hot flashes.  Genitourinary: Negative for difficulty urinating and frequency.   Musculoskeletal: Negative for arthralgias.  Skin: Negative for itching and rash.  Neurological: Negative for extremity weakness.  Hematological: Negative  for adenopathy.  Psychiatric/Behavioral: Negative for confusion.    MEDICAL HISTORY:  Past Medical History:  Diagnosis Date  . Anemia   . Bipolar disorder ([place])   . History of colonic polyps   . Mental impairment   . Schizophrenia, unspecified ([place])   . Seasonal allergies   . UTI (urinary tract infection)     SURGICAL HISTORY: Past Surgical History:  Procedure Laterality Date  . COLONOSCOPY WITH PROPOFOL N/A 12/06/2017   Procedure: COLONOSCOPY WITH PROPOFOL;  Surgeon: Lucilla Lame, MD;  Location: [place];  Service: Endoscopy;  Laterality: N/A;  . POLYPECTOMY  12/06/2017   Procedure: POLYPECTOMY;  Surgeon: Lucilla Lame, MD;  Location: [place];  Service: Endoscopy;;    SOCIAL HISTORY: Social History   Socioeconomic History  . Marital status: Single    Spouse name: Not on file  . Number of children: 0  . Years of education: Not on file  . Highest education level: Not on file  Occupational History  . Not on file  Social Needs  . Financial resource strain: Not on file  . Food insecurity    Worry: Not on file    Inability: Not on file  . Transportation needs    Medical: Not on file    Non-medical: Not on file  Tobacco Use  . Smoking status: Never Smoker  . Smokeless tobacco: Never Used  Substance and Sexual Activity  . Alcohol use: No  . Drug use: No  . Sexual activity: Not on file  Lifestyle  . Physical activity    Days per week: Not on file    Minutes per session: Not on file  . Stress: Not on file  Relationships  . Social connections    Talks on phone: Not on file  Gets together: Not on file    Attends religious service: Not on file    Active member of club or organization: Not on file    Attends meetings of clubs or organizations: Not on file    Relationship status: Not on file  . Intimate partner violence    Fear of current or ex partner: Not on file    Emotionally abused: Not on file    Physically abused: Not on file    Forced  sexual activity: Not on file  Other Topics Concern  . Not on file  Social History Narrative   Single.   No children.   On disability.     FAMILY HISTORY: Family History  Problem Relation Age of Onset  . Lung cancer Maternal Grandmother     ALLERGIES:  is allergic to tramadol.  MEDICATIONS:  Current Outpatient Medications  Medication Sig Dispense Refill  . benztropine (COGENTIN) 2 MG tablet Take 2 mg by mouth 2 (two) times daily.    . Cholecalciferol (VITAMIN D-3) 1000 units CAPS Take 2,000 Units by mouth daily.    Marland Kitchen CRANBERRY PO Take by mouth daily.    . haloperidol decanoate (HALDOL DECANOATE) 50 MG/ML injection Inject 175 mg into the muscle every 28 (twenty-eight) days.     . Misc Natural Products (CRANBERRY/PROBIOTIC PO) Take by mouth daily.    . Probiotic Product ([place]) Take by mouth daily.     No current facility-administered medications for this visit.      PHYSICAL EXAMINATION: ECOG PERFORMANCE STATUS: 0 - Asymptomatic Vitals:   05/20/19 0959  BP: 113/74  Pulse: 60  Temp: (!) 97.2 F (36.2 C)   Filed Weights   05/20/19 0959  Weight: 193 lb 3 oz (87.6 kg)    Physical Exam Constitutional:      General: She is not in acute distress. HENT:     Head: Normocephalic and atraumatic.  Eyes:     General: No scleral icterus.    Pupils: Pupils are equal, round, and reactive to light.  Neck:     Musculoskeletal: Normal range of motion and neck supple.  Cardiovascular:     Rate and Rhythm: Normal rate and regular rhythm.     Heart sounds: Normal heart sounds.  Pulmonary:     Effort: Pulmonary effort is normal. No respiratory distress.     Breath sounds: No wheezing.  Abdominal:     General: Bowel sounds are normal. There is no distension.     Palpations: Abdomen is soft. There is no mass.     Tenderness: There is no abdominal tenderness.  Musculoskeletal: Normal range of motion.        General: No deformity.  Skin:    General: Skin is  warm and dry.     Findings: No erythema or rash.  Neurological:     Mental Status: She is alert and oriented to person, place, and time.     Cranial Nerves: No cranial nerve deficit.     Coordination: Coordination normal.  Psychiatric:        Behavior: Behavior normal.        Thought Content: Thought content normal.     RADIOGRAPHIC STUDIES: I have personally reviewed the radiological images as listed and agreed with the findings in the report.  CMP Latest Ref Rng & Units 05/20/2019  Glucose 70 - 99 mg/dL -  BUN 6 - 23 mg/dL -  Creatinine 0.40 - 1.20 mg/dL -  Sodium 135 -  145 mEq/L -  Potassium 3.5 - 5.1 mEq/L -  Chloride 96 - 112 mEq/L -  CO2 19 - 32 mEq/L -  Calcium 8.4 - 10.5 mg/dL -  Total Protein 6.5 - 8.1 g/dL 8.1  Total Bilirubin 0.3 - 1.2 mg/dL 0.5  Alkaline Phos 38 - 126 U/L 66  AST 15 - 41 U/L 19  ALT 0 - 44 U/L 12   CBC Latest Ref Rng & Units 05/20/2019  WBC 4.0 - 10.5 K/uL 3.2(L)  Hemoglobin 12.0 - 15.0 g/dL 13.2  Hematocrit 36.0 - 46.0 % 41.0  Platelets 150 - 400 K/uL 163    LABORATORY DATA:  I have reviewed the data as listed Lab Results  Component Value Date   WBC 3.2 (L) 05/20/2019   HGB 13.2 05/20/2019   HCT 41.0 05/20/2019   MCV 93.2 05/20/2019   PLT 163 05/20/2019   Recent Labs    05/07/19 1223 05/20/19 1046  NA 143  --   K 4.0  --   CL 108  --   CO2 28  --   GLUCOSE 88  --   BUN 8  --   CREATININE 0.87  --   CALCIUM 9.1  --   PROT  --  8.1  ALBUMIN  --  3.8  AST  --  19  ALT  --  12  ALKPHOS  --  66  BILITOT  --  0.5  BILIDIR  --  0.1  IBILI  --  0.4   Iron/TIBC/Ferritin/ %Sat No results found for: IRON, TIBC, FERRITIN, IRONPCTSAT   RADIOGRAPHIC STUDIES: I have personally reviewed the radiological images as listed and agreed with the findings in the report. No results found.   ASSESSMENT & PLAN:  1. Leukopenia, unspecified type    I discussed with patient that the differential diagnosis of the thrombosis is broad,  including infection, inflammation, nutrition deficiency, malignant etiology including underlying bone morrow disorders.  For the work up of patient's thrombocytopenia, I recommend checking CBC;CMP, LDH; smear review, folate, Vitamin B12, hepatitis, HIV,  flowcytometry  Also, discussed with the patient that if no clear etiology found- bone marrow biopsy would be suggested. Currently await for the above workup.   # Patient follow-up with me in approximately 2 weeks to review the above results.   Orders Placed This Encounter  Procedures  . CBC with Differential/Platelet    Standing Status:   Future    Number of Occurrences:   1    Standing Expiration Date:   05/19/2020  . Lactate dehydrogenase    Standing Status:   Future    Number of Occurrences:   1    Standing Expiration Date:   05/19/2020  . Hepatic function panel    Standing Status:   Future    Number of Occurrences:   1    Standing Expiration Date:   05/19/2020  . HIV Antibody (routine testing w rflx)    Standing Status:   Future    Number of Occurrences:   1    Standing Expiration Date:   05/19/2020  . Hepatitis B surface antigen    Standing Status:   Future    Number of Occurrences:   1    Standing Expiration Date:   05/19/2020  . Hepatitis C antibody    Standing Status:   Future    Number of Occurrences:   1    Standing Expiration Date:   05/19/2020  . Hepatitis B core antibody, IgM  Standing Status:   Future    Number of Occurrences:   1    Standing Expiration Date:   05/19/2020  . Technologist smear review    Standing Status:   Future    Number of Occurrences:   1    Standing Expiration Date:   05/19/2020  . Vitamin B12    Standing Status:   Future    Number of Occurrences:   1    Standing Expiration Date:   05/19/2020  . Folate    Standing Status:   Future    Number of Occurrences:   1    Standing Expiration Date:   05/19/2020  . Flow cytometry panel-leukemia/lymphoma work-up    Standing Status:   Future    Number  of Occurrences:   1    Standing Expiration Date:   05/19/2020    All questions were answered. The patient knows to call the clinic with any problems questions or concerns.  Cc Pleas Koch, NP  Return of visit: 2 weeks Thank you for this kind referral and the opportunity to participate in the care of this patient. A copy of today's note is routed to referring provider   Total face to face encounter time for this patient visit was 45 min. >50% of the time was  spent in counseling and coordination of care.    Earlie Server, MD, PhD Hematology Oncology [facility] Pager- [phone] 05/20/2019

## 2019-05-21 LAB — HEPATITIS B CORE ANTIBODY, IGM: Hep B C IgM: NEGATIVE

## 2019-05-21 LAB — HIV ANTIBODY (ROUTINE TESTING W REFLEX): HIV Screen 4th Generation wRfx: NONREACTIVE

## 2019-05-21 LAB — HEPATITIS C ANTIBODY: HCV Ab: 0.1 s/co ratio (ref 0.0–0.9)

## 2019-05-21 LAB — HEPATITIS B SURFACE ANTIGEN: Hepatitis B Surface Ag: NEGATIVE

## 2019-05-25 LAB — COMP PANEL: LEUKEMIA/LYMPHOMA

## 2019-06-01 ENCOUNTER — Telehealth: Payer: Self-pay | Admitting: *Deleted

## 2019-06-01 NOTE — Telephone Encounter (Signed)
Patient called asking for lab results CBC with Differential/Platelet Order: [id number] Status:  Final result Visible to patient:  No (not released) Next appt:  06/04/2019 at 10:00 AM in Oncology Earlie Server, MD) Dx:  Leukopenia, unspecified type (important suggestion)  Newer results are available. Click to view them now.   Ref Range & Units 12d ago 3wk ago  WBC 4.0 - 10.5 K/uL 3.2Low   2.9Low    RBC 3.87 - 5.11 MIL/uL 4.40  4.32 R   Hemoglobin 12.0 - 15.0 g/dL 13.2  13.1   HCT 36.0 - 46.0 % 41.0  40.0   MCV 80.0 - 100.0 fL 93.2  92.7 R   MCH 26.0 - 34.0 pg 30.0    MCHC 30.0 - 36.0 g/dL 32.2  32.7   RDW 11.5 - 15.5 % 13.0  13.8   Platelets 150 - 400 K/uL 163  181.0 R   nRBC 0.0 - 0.2 % 0.0    Comment: Performed at [facility], [place], [place] [address]  Neutrophils Relative % % ORDER MODIFIED OR RESCHEDULED    Neutro Abs 1.7 - 7.7 K/uL ORDER MODIFIED OR RESCHEDULED    Band Neutrophils % ORDER MODIFIED OR RESCHEDULED    Lymphocytes Relative % ORDER MODIFIED OR RESCHEDULED    Lymphs Abs 0.7 - 4.0 K/uL ORDER MODIFIED OR RESCHEDULED    Monocytes Relative % ORDER MODIFIED OR RESCHEDULED    Monocytes Absolute 0.1 - 1.0 K/uL ORDER MODIFIED OR RESCHEDULED    Eosinophils Relative % ORDER MODIFIED OR RESCHEDULED    Eosinophils Absolute 0.0 - 0.5 K/uL ORDER MODIFIED OR RESCHEDULED    Basophils Relative % ORDER MODIFIED OR RESCHEDULED    Basophils Absolute 0.0 - 0.1 K/uL ORDER MODIFIED OR RESCHEDULED    WBC Morphology  ORDER MODIFIED OR RESCHEDULED    RBC Morphology  ORDER MODIFIED OR RESCHEDULED    Smear Review  ORDER MODIFIED OR RESCHEDULED    Other % ORDER MODIFIED OR RESCHEDULED    nRBC 0 /100 WBC ORDER MODIFIED OR RESCHEDULED    Metamyelocytes Relative % ORDER MODIFIED OR RESCHEDULED    Myelocytes % ORDER MODIFIED OR RESCHEDULED    Promyelocytes Relative % ORDER MODIFIED OR RESCHEDULED    Blasts % ORDER MODIFIED OR RESCHEDULED    Resulting Agency  [place] [facility]  [place] [facility]      Specimen Collected: 05/20/19 10:46 Last Resulted: 05/20/19 11:25     Lab Flowsheet   Order Details   View Encounter   Lab and Collection Details   Routing   Result History     R=Reference range differs from displayed range      Other Results from 05/20/2019  Differential Order: [id number]  Status:  Final result Visible to patient:  No (not released) Next appt:  06/04/2019 at 10:00 AM in Oncology Earlie Server, MD)  Ref Range & Units 12d ago (05/20/19) 12d ago (05/20/19)  Neutrophils Relative % % 56  ORDER MODIFIED OR RESCHEDULED   Neutro Abs 1.7 - 7.7 K/uL 1.7  ORDER MODIFIED OR RESCHEDULED   Lymphocytes Relative % 32  ORDER MODIFIED OR RESCHEDULED   Lymphs Abs 0.7 - 4.0 K/uL 1.0  ORDER MODIFIED OR RESCHEDULED   Monocytes Relative % 10  ORDER MODIFIED OR RESCHEDULED   Monocytes Absolute 0.1 - 1.0 K/uL 0.3  ORDER MODIFIED OR RESCHEDULED   Eosinophils Relative % 2  ORDER MODIFIED OR RESCHEDULED   Eosinophils Absolute 0.0 - 0.5 K/uL 0.1  ORDER MODIFIED OR RESCHEDULED  Basophils Relative % 0  ORDER MODIFIED OR RESCHEDULED   Basophils Absolute 0.0 - 0.1 K/uL 0.0  ORDER MODIFIED OR RESCHEDULED   Immature Granulocytes % 0    Abs Immature Granulocytes 0.00 - 0.07 K/uL 0.01    Comment: Performed at [facility], [place], [place] [address]  WBC   3.2Low  R   nRBC   0.0 R, CM   Band Neutrophils   ORDER MODIFIED OR RESCHEDULED R   WBC Morphology   ORDER MODIFIED OR RESCHEDULED   RBC Morphology   ORDER MODIFIED OR RESCHEDULED   Smear Review   ORDER MODIFIED OR RESCHEDULED   Other   ORDER MODIFIED OR RESCHEDULED R   nRBC   ORDER MODIFIED OR RESCHEDULED R   Metamyelocytes Relative   ORDER MODIFIED OR RESCHEDULED R   Myelocytes   ORDER MODIFIED OR RESCHEDULED R   Promyelocytes Relative   ORDER MODIFIED OR RESCHEDULED R   RBC   4.40 R   Blasts   ORDER MODIFIED OR RESCHEDULED R   Hemoglobin   13.2 R   HCT   41.0 R   MCV   93.2 R   MCH    30.0 R   MCHC   32.2 R   RDW   13.0 R   Platelets   163 R   Resulting Agency  [place] [facility] [place] [facility]      Specimen Collected: 05/20/19 11:25 Last Resulted: 05/20/19 11:29     Lab Flowsheet   Order Details   View Encounter   Lab and Collection Details   Routing   Result History     CM=Additional commentsR=Reference range differs from displayed range        Flow cytometry panel-leukemia/lymphoma work-up Order: [id number]  Status:  Edited Result - FINAL Visible to patient:  No (not released) Next appt:  06/04/2019 at 10:00 AM in Oncology Earlie Server, MD) Dx:  Leukopenia, unspecified type Component 12d ago  PATH INTERP XXX-IMP Comment   Comment: No significant immunophenotypic abnormality detected  CLINICAL INFO Comment VC   Comment: (NOTE)  Accompanying CBC dated 05/20/2019 shows:  WBC count 3.2, Neu 1.7, Lym 1.0, Mon 0.3.   Specimen Type Comment   Comment: Peripheral blood  ASSESSMENT OF LEUKOCYTES Comment   Comment: (NOTE)  No monoclonal B cell population is detected.  kappa:lambda ratio 1.8  There is no loss of, or aberrant expression of, the pan T cell  antigens to  suggest a neoplastic T cell process.  CD4:CD8 ratio 3.4  No circulating blasts are detected.  There is no immunophenotypic evidence of abnormal myeloid maturation.  Rare  neutrophils show slightly left-shifted maturation.  Rare monocytes show partial dim aberrant expression of CD23 and CD56,  findings that can be seen in association with both reactive/activated  processes as well as neoplastic processes.  Analysis of the leukocyte population shows: granulocytes 68%,  monocytes 6%,  lymphocytes 26%, blasts <0.1%, B cells 4%, T cells 19%, NK cells 3%.   % Viable Cells Comment VC   Comment: 90%  ANALYSIS AND GATING STRATEGY Comment   Comment: 8 color analysis with CD45/SSC gating  IMMUNOPHENOTYPING STUDY Comment   Comment: (NOTE)  CD2    Normal     CD3    Normal  CD4     Normal     CD5    Normal  CD7    Normal     CD8    Normal  CD10  Normal     CD11b   Normal  CD13   Normal     CD14   Normal  CD16   Normal     CD19   Normal  CD20   Normal     CD23   See Text  CD33   Normal     CD34   Normal  CD38   Normal     CD45   Normal  CD56   See Text    CD57   Normal  CD117   Normal     HLA-DR  Normal  KAPPA   Normal     LAMBDA  Normal  CD64   Normal   PATHOLOGIST NAME Comment   Comment: Cecilie Kicks, M. D.  COMMENT: Comment VC   Comment: (NOTE)  Each antibody in this assay was utilized to assess for potential  abnormalities of studied cell populations or to characterize  identified abnormalities.  This test was developed and its performance characteristics  determined by [facility]. It has not been cleared or approved by the  U.S. Food and Drug Administration.  The FDA has determined that such clearance or approval is not  necessary. This test is used for clinical purposes. It should not  be regarded as investigational or for research.  Performed At: -[facility]  [place], [place] [id number]  Katina Degree MDPhD [id number]  Performed At: [facility]  [address] [place], [place] [id number]  Katina Degree MDPhD [id number]   Resulting Agency [facility]      Specimen Collected: 05/20/19 10:46 Last Resulted: 05/25/19 10:36     Lab Flowsheet   Order Details   View Encounter   Lab and Collection Details   Routing   Result History     VC=Value has a corrected status         Folate Order: [id number]  Status:  Final result Visible to patient:  No (not released) Next appt:  06/04/2019 at 10:00 AM in Oncology Earlie Server, MD) Dx:  Leukopenia, unspecified type  Ref Range & Units 12d ago  Folate >5.9 ng/mL 7.7   Comment: Performed at [facility], [address] [place], [place] [address]   Resulting Agency  [facility]      Specimen Collected: 05/20/19 10:46 Last Resulted: 05/20/19 12:41     Lab Flowsheet   Order Details   View Encounter   Lab and Collection Details   Routing   Result History           Vitamin B12 Order: [id number]  Status:  Final result Visible to patient:  No (not released) Next appt:  06/04/2019 at 10:00 AM in Oncology Earlie Server, MD) Dx:  Leukopenia, unspecified type  Ref Range & Units 12d ago  Vitamin B-12 180 - 914 pg/mL 271   Comment: (NOTE)  This assay is not validated for testing neonatal or  myeloproliferative syndrome specimens for Vitamin B12 levels.  Performed at [facility], [place] [address], [place], [place]  [address]   Resulting Agency  [facility]      Specimen Collected: 05/20/19 10:46 Last Resulted: 05/20/19 16:16     Lab Flowsheet   Order Details   View Encounter   Lab and Collection Details   Routing   Result History           Hepatitis B surface antigen Order: [id number]  Status:  Final result Visible to patient:  No (not released) Next appt:  06/04/2019 at 10:00 AM in Oncology Earlie Server, MD) Dx:  Leukopenia, unspecified type  Ref Range & Units 12d ago  Hepatitis B Surface Ag Negative Negative   Comment: (NOTE)  Performed At: [facility]  [address] [place], [place] [id number]  Rush Farmer MD YY:[id number]   Resulting Agency  [facility]      Specimen Collected: 05/20/19 10:46 Last Resulted: 05/21/19 07:38     Lab Flowsheet   Order Details   View Encounter   Lab and Collection Details   Routing   Result History           Hepatitis C antibody Order: [id number]  Status:  Final result Visible to patient:  No (not released) Next appt:  06/04/2019 at 10:00 AM in Oncology Earlie Server, MD) Dx:  Leukopenia, unspecified type  Ref Range & Units 12d ago  HCV Ab 0.0 - 0.9 s/co ratio 0.1   Comment: (NOTE)                  Negative:   < 0.8                 Indeterminate: 0.8 - 0.9                  Positive:   > 0.9  The CDC recommends that a positive HCV antibody result  be followed up with a HCV Nucleic Acid Amplification  test (494496).  Performed At: [facility]  [place], [place] [id number]  Rush Farmer MD KZ:[id number]   Resulting Agency  [facility]      Specimen Collected: 05/20/19 10:46 Last Resulted: 05/21/19 07:38     Lab Flowsheet   Order Details   View Encounter   Lab and Collection Details   Routing   Result History           Hepatitis B core antibody, IgM Order: [id number]  Status:  Final result Visible to patient:  No (not released) Next appt:  06/04/2019 at 10:00 AM in Oncology Earlie Server, MD) Dx:  Leukopenia, unspecified type  Ref [place] & Units 12d ago  Hep B C IgM Negative Negative   Comment: (NOTE)  Performed At: [facility]  [place], [place] [id number]  Rush Farmer MD [id number]   Resulting Agency  [facility]      Specimen Collected: 05/20/19 10:46 Last Resulted: 05/21/19 07:38     Lab Flowsheet   Order Details   View Encounter   Lab and Collection Details   Routing   Result History           Technologist smear review Order: [id number]  Status:  Final result Visible to patient:  No (not released) Next appt:  06/04/2019 at 10:00 AM in Oncology Earlie Server, MD) Dx:  Leukopenia, unspecified type Component 12d ago  Tech Review PLATELETS APPEAR ADEQUATE   Comment: Normal platelet morphology  RBC MORPHOLOGY NORMAL  WBC MORPHOLOGY UNREMARKABLE  Performed at [facility], [address], [place], [place] [address]   Resulting Agency [facility]      Specimen Collected: 05/20/19 10:46 Last Resulted: 05/20/19 11:55     Lab Flowsheet   Order Details   View Encounter   Lab and Collection Details   Routing   Result History           HIV Antibody (routine testing w  rflx) Order: [id number]  Status:  Final result Visible to patient:  No (not released) Next appt:  06/04/2019 at 10:00 AM in Oncology Earlie Server, MD) Dx:  Leukopenia, unspecified type  Ref [place] & Units 12d ago  HIV Screen 4th Generation wRfx Non Reactive Non Reactive   Comment: (NOTE)  Performed At: [facility]  [place], [place] [id number]  Rush Farmer MD VF:[id number]   Resulting Agency  [facility]      Specimen Collected: 05/20/19 10:46 Last Resulted: 05/21/19 06:36     Lab Flowsheet   Order Details   View Encounter   Lab and Collection Details   Routing   Result History           Lactate dehydrogenase Order: [id number]  Status:  Final result Visible to patient:  No (not released) Next appt:  06/04/2019 at 10:00 AM in Oncology Earlie Server, MD) Dx:  Leukopenia, unspecified type  Ref [place] & Units 12d ago  LDH 98 - 192 U/L 169   Comment: Performed at [facility], [address] [place], [place] [address]  Resulting Agency  [facility]      Specimen Collected: 05/20/19 10:46 Last Resulted: 05/20/19 11:33     Lab Flowsheet   Order Details   View Encounter   Lab and Collection Details   Routing   Result History           Hepatic function panel Order: [id number]  Status:  Final result Visible to patient:  No (not released) Next appt:  06/04/2019 at 10:00 AM in Oncology Earlie Server, MD) Dx:  Leukopenia, unspecified type  Ref [place] & Units 12d ago  Total Protein 6.5 - 8.1 g/dL 8.1   Albumin 3.5 - 5.0 g/dL 3.8   AST 15 - 41 U/L 19   ALT 0 - 44 U/L 12   Alkaline Phosphatase 38 - 126 U/L 66   Total Bilirubin 0.3 - 1.2 mg/dL 0.5   Bilirubin, Direct 0.0 - 0.2 mg/dL 0.1   Indirect Bilirubin 0.3 - 0.9 mg/dL 0.4   Comment: Performed at [facility], [address] [place], [place] [address]  Resulting Agency  [facility]      Specimen Collected: 05/20/19 10:46 Last Resulted: 05/20/19 11:33

## 2019-06-04 ENCOUNTER — Inpatient Hospital Stay (HOSPITAL_BASED_OUTPATIENT_CLINIC_OR_DEPARTMENT_OTHER): Payer: Medicare Other | Admitting: Oncology

## 2019-06-04 ENCOUNTER — Other Ambulatory Visit: Payer: Self-pay

## 2019-06-04 ENCOUNTER — Encounter: Payer: Self-pay | Admitting: Oncology

## 2019-06-04 VITALS — BP 105/72 | HR 60 | Temp 96.7°F | Resp 16 | Wt 190.4 lb

## 2019-06-04 DIAGNOSIS — D72819 Decreased white blood cell count, unspecified: Secondary | ICD-10-CM | POA: Diagnosis not present

## 2019-06-04 DIAGNOSIS — F209 Schizophrenia, unspecified: Secondary | ICD-10-CM

## 2019-06-04 MED ORDER — VITAMIN B-12 1000 MCG PO TABS: 1000.0000 ug | ORAL_TABLET | Freq: Every day | ORAL | 1 refills | Status: AC

## 2019-06-04 NOTE — Progress Notes (Signed)
Hematology/Oncology follow up [facility] [phone] [phone]   Patient Care Team: Pleas Koch, NP as PCP - General (Internal Medicine)  REFERRING PROVIDER: Pleas Koch, NP REASON FOR VISIT:  Follow up for leukopenia  HISTORY OF PRESENTING ILLNESS:  Tina Clark is a 53 y.o. female who was seen in consultation at the request of Pleas Koch, NP for evaluation of leukopenia. Reviewed patient's recent labs. Patient has low total WBC count was 3.2, Previous lab records at [facility] system was reviewed via care everywhere.. Leukopenia duration is chronic onset, duration is since at least 2015.  Predominantly lymphocytopenia. No aggravating or improving factors.   Associated symptoms:  denies fatigue, weight loss, fever, chills, frequent infection.  History hepatitis or HIV infection: Denies History of chronic liver disease: Denies History of blood transfusion: Denies Alcohol consumption: Denies Diet Vegetarian or Vegan: Denies Herbal medication: St. John's wort.  No other herbal supplementation.  Reports that certain food will cause her to have white spots on the skin.  Currently she does not have any skin rash. History of schizophrenia, on Cogentin and Haldol.  INTERVAL HISTORY Tina Clark is a 53 y.o. female who has above history reviewed by me today presents for follow up visit for discussion of lab work-up for leukopenia. Problems and complaints are listed below: SHe has no new complaint  Review of Systems  Constitutional: Negative for appetite change, chills, fatigue and fever.  HENT:   Negative for hearing loss and voice change.   Eyes: Negative for eye problems.  Respiratory: Negative for chest tightness and cough.   Cardiovascular: Negative for chest pain.  Gastrointestinal: Negative for abdominal distention, abdominal pain and blood in stool.  Endocrine: Negative for hot flashes.   Genitourinary: Negative for difficulty urinating and frequency.   Musculoskeletal: Negative for arthralgias.  Skin: Negative for itching and rash.  Neurological: Negative for extremity weakness.  Hematological: Negative for adenopathy.  Psychiatric/Behavioral: Negative for confusion.    MEDICAL HISTORY:  Past Medical History:  Diagnosis Date  . Anemia   . Bipolar disorder ([place])   . History of colonic polyps   . Mental impairment   . Schizophrenia, unspecified ([place])   . Seasonal allergies   . UTI (urinary tract infection)     SURGICAL HISTORY: Past Surgical History:  Procedure Laterality Date  . COLONOSCOPY WITH PROPOFOL N/A 12/06/2017   Procedure: COLONOSCOPY WITH PROPOFOL;  Surgeon: Lucilla Lame, MD;  Location: [place];  Service: Endoscopy;  Laterality: N/A;  . POLYPECTOMY  12/06/2017   Procedure: POLYPECTOMY;  Surgeon: Lucilla Lame, MD;  Location: [place];  Service: Endoscopy;;    SOCIAL HISTORY: Social History   Socioeconomic History  . Marital status: Single    Spouse name: Not on file  . Number of children: 0  . Years of education: Not on file  . Highest education level: Not on file  Occupational History  . Not on file  Social Needs  . Financial resource strain: Not on file  . Food insecurity    Worry: Not on file    Inability: Not on file  . Transportation needs    Medical: Not on file    Non-medical: Not on file  Tobacco Use  . Smoking status: Never Smoker  . Smokeless tobacco: Never Used  Substance and Sexual Activity  . Alcohol use: No  . Drug use: No  . Sexual activity: Not on file  Lifestyle  . Physical activity  Days per week: Not on file    Minutes per session: Not on file  . Stress: Not on file  Relationships  . Social Herbalist on phone: Not on file    Gets together: Not on file    Attends religious service: Not on file    Active member of club or organization: Not on file    Attends meetings of clubs  or organizations: Not on file    Relationship status: Not on file  . Intimate partner violence    Fear of current or ex partner: Not on file    Emotionally abused: Not on file    Physically abused: Not on file    Forced sexual activity: Not on file  Other Topics Concern  . Not on file  Social History Narrative   Single.   No children.   On disability.     FAMILY HISTORY: Family History  Problem Relation Age of Onset  . Lung cancer Maternal Grandmother     ALLERGIES:  is allergic to tramadol.  MEDICATIONS:  Current Outpatient Medications  Medication Sig Dispense Refill  . benztropine (COGENTIN) 2 MG tablet Take 2 mg by mouth 2 (two) times daily.    . Cholecalciferol (VITAMIN D-3) 1000 units CAPS Take 2,000 Units by mouth daily.    Marland Kitchen CRANBERRY PO Take by mouth daily.    . haloperidol decanoate (HALDOL DECANOATE) 50 MG/ML injection Inject 175 mg into the muscle every 28 (twenty-eight) days.     . Misc Natural Products (CRANBERRY/PROBIOTIC PO) Take by mouth daily.    . Probiotic Product ([place]) Take by mouth daily.    . ST JOHNS WORT PO Take by mouth.    . vitamin B-12 (CYANOCOBALAMIN) 1000 MCG tablet Take 1 tablet (1,000 mcg total) by mouth daily. 90 tablet 1   No current facility-administered medications for this visit.      PHYSICAL EXAMINATION: ECOG PERFORMANCE STATUS: 0 - Asymptomatic Vitals:   06/04/19 0956  BP: 105/72  Pulse: 60  Resp: 16  Temp: (!) 96.7 F (35.9 C)   Filed Weights   06/04/19 0956  Weight: 190 lb 7 oz (86.4 kg)    Physical Exam Constitutional:      General: She is not in acute distress. HENT:     Head: Normocephalic and atraumatic.  Eyes:     General: No scleral icterus.    Pupils: Pupils are equal, round, and reactive to light.  Neck:     Musculoskeletal: Normal range of motion and neck supple.  Cardiovascular:     Rate and Rhythm: Normal rate and regular rhythm.     Heart sounds: Normal heart sounds.   Pulmonary:     Effort: Pulmonary effort is normal. No respiratory distress.     Breath sounds: No wheezing.  Abdominal:     General: Bowel sounds are normal. There is no distension.     Palpations: Abdomen is soft. There is no mass.     Tenderness: There is no abdominal tenderness.  Musculoskeletal: Normal range of motion.        General: No deformity.  Skin:    General: Skin is warm and dry.     Findings: No erythema or rash.  Neurological:     Mental Status: She is alert and oriented to person, place, and time.     Cranial Nerves: No cranial nerve deficit.     Coordination: Coordination normal.  Psychiatric:  Behavior: Behavior normal.        Thought Content: Thought content normal.     RADIOGRAPHIC STUDIES: I have personally reviewed the radiological images as listed and agreed with the findings in the report.  CMP Latest Ref Rng & Units 05/20/2019  Glucose 70 - 99 mg/dL -  BUN 6 - 23 mg/dL -  Creatinine 0.40 - 1.20 mg/dL -  Sodium 135 - 145 mEq/L -  Potassium 3.5 - 5.1 mEq/L -  Chloride 96 - 112 mEq/L -  CO2 19 - 32 mEq/L -  Calcium 8.4 - 10.5 mg/dL -  Total Protein 6.5 - 8.1 g/dL 8.1  Total Bilirubin 0.3 - 1.2 mg/dL 0.5  Alkaline Phos 38 - 126 U/L 66  AST 15 - 41 U/L 19  ALT 0 - 44 U/L 12   CBC Latest Ref Rng & Units 05/20/2019  WBC 4.0 - 10.5 K/uL 3.2(L)  Hemoglobin 12.0 - 15.0 g/dL 13.2  Hematocrit 36.0 - 46.0 % 41.0  Platelets 150 - 400 K/uL 163    LABORATORY DATA:  I have reviewed the data as listed Lab Results  Component Value Date   WBC 3.2 (L) 05/20/2019   HGB 13.2 05/20/2019   HCT 41.0 05/20/2019   MCV 93.2 05/20/2019   PLT 163 05/20/2019   Recent Labs    05/07/19 1223 05/20/19 1046  NA 143  --   K 4.0  --   CL 108  --   CO2 28  --   GLUCOSE 88  --   BUN 8  --   CREATININE 0.87  --   CALCIUM 9.1  --   PROT  --  8.1  ALBUMIN  --  3.8  AST  --  19  ALT  --  12  ALKPHOS  --  66  BILITOT  --  0.5  BILIDIR  --  0.1  IBILI  --   0.4   Iron/TIBC/Ferritin/ %Sat No results found for: IRON, TIBC, FERRITIN, IRONPCTSAT   RADIOGRAPHIC STUDIES: I have personally reviewed the radiological images as listed and agreed with the findings in the report. No results found.   ASSESSMENT & PLAN:  1. Leukopenia, unspecified type   Labs are reviewed and discussed with patient. She has mild leukopenia, with complete normal differential. Normal LDH, negative HIV, hepatitis panel, smear nonremarkable morphology. Normal folate.  Low normal vitamin B12. Flow cytometry showed no significant immunophenotypic aberrancy Patient is asymptomatic, no frequent infections. Leukopenia is chronic since at least 2015 I recommend withholding additional work-up at this point.  Continue observe. Recommend CBC twice a year.  Low normal vitamin B12, recommend patient to start oral vitamin B12 Orders Placed This Encounter  Procedures  . Vitamin B12    Standing Status:   Future    Standing Expiration Date:   02/02/2020  . CBC with Differential/Platelet    Standing Status:   Future    Standing Expiration Date:   06/03/2020    All questions were answered. The patient knows to call the clinic with any problems questions or concerns.  Cc Pleas Koch, NP  Return of visit: 6 months  Earlie Server, MD, PhD Hematology Oncology [facility] at [facility] 06/04/2019

## 2019-06-14 ENCOUNTER — Ambulatory Visit
Admission: EM | Admit: 2019-06-14 | Discharge: 2019-06-14 | Disposition: A | Discharge status: Home / Self Care | Payer: Medicare Other | Attending: Family Medicine | Admitting: Family Medicine

## 2019-06-14 ENCOUNTER — Other Ambulatory Visit: Payer: Self-pay

## 2019-06-14 DIAGNOSIS — N3001 Acute cystitis with hematuria: Secondary | ICD-10-CM

## 2019-06-14 LAB — URINALYSIS, COMPLETE (UACMP) WITH MICROSCOPIC
Glucose, UA: NEGATIVE mg/dL
Ketones, ur: 40 mg/dL — AB
Nitrite: POSITIVE — AB
Protein, ur: 100 mg/dL — AB
Specific Gravity, Urine: >1.03 — ABNORMAL HIGH (ref 1.005–1.030)
WBC, UA: >50 WBC/hpf (ref 0–5)
pH: 5 (ref 5.0–8.0)

## 2019-06-14 MED ORDER — CEPHALEXIN 500 MG PO CAPS: 500.0000 mg | ORAL_CAPSULE | Freq: Two times a day (BID) | ORAL | 0 refills | Status: DC

## 2019-06-14 NOTE — Discharge Instructions (Signed)
Antibiotic as prescribed.  Take care  Dr. Valina Maes  

## 2019-06-14 NOTE — ED Triage Notes (Addendum)
Pt here for UTI and unsure how long she has had it. Did notice hematuria, frequency and dysuria on Monday. No other complaints. Did take azo

## 2019-06-14 NOTE — ED Provider Notes (Signed)
[facility]    CSN: [id number] Arrival date & time: 06/14/19  1252  History   Chief Complaint Chief Complaint  Patient presents with  . Urinary Tract Infection   HPI  53 year old female presents with concerns for UTI.  Patient reports that her symptoms started Monday.  She reports dysuria and hematuria.  She also reports urinary frequency.  She has taken some Azo without resolution.  No abdominal pain.  No fever.  No known exacerbating factors.  No other associated symptoms.  No other complaints.  PMH, Surgical Hx, Family Hx, Social History reviewed and updated as below.  Past Medical History:  Diagnosis Date  . Anemia   . Bipolar disorder ([place])   . History of colonic polyps   . Mental impairment   . Schizophrenia, unspecified ([place])   . Seasonal allergies   . UTI (urinary tract infection)     Patient Active Problem List   Diagnosis Date Noted  . Pruritus 05/07/2019  . Bipolar 1 disorder ([place]) 02/04/2019  . History of schizophrenia 02/04/2019  . Special screening for malignant neoplasms, colon   . Benign neoplasm of ascending colon   . Polyp of sigmoid colon   . Acute pain of left knee 08/14/2017  . Effusion of left knee 08/14/2017    Past Surgical History:  Procedure Laterality Date  . COLONOSCOPY WITH PROPOFOL N/A 12/06/2017   Procedure: COLONOSCOPY WITH PROPOFOL;  Surgeon: Lucilla Lame, MD;  Location: [place];  Service: Endoscopy;  Laterality: N/A;  . POLYPECTOMY  12/06/2017   Procedure: POLYPECTOMY;  Surgeon: Lucilla Lame, MD;  Location: [place];  Service: Endoscopy;;    OB History   No obstetric history on file.      Home Medications    Prior to Admission medications   Medication Sig Start Date End Date Taking? Authorizing Provider  benztropine (COGENTIN) 2 MG tablet Take 2 mg by mouth 2 (two) times daily.   Yes [provider]  Cholecalciferol (VITAMIN D-3) 1000 units CAPS Take 2,000 Units by mouth daily.   Yes  [provider]  haloperidol decanoate (HALDOL DECANOATE) 50 MG/ML injection Inject 175 mg into the muscle every 28 (twenty-eight) days.    Yes [provider]  [facility]ORT PO Take by mouth.   Yes [provider]  vitamin B-12 (CYANOCOBALAMIN) 1000 MCG tablet Take 1 tablet (1,000 mcg total) by mouth daily. 06/04/19  Yes Earlie Server, MD  cephALEXin (KEFLEX) 500 MG capsule Take 1 capsule (500 mg total) by mouth 2 (two) times daily. 06/14/19   Ezzard Ditmer G, DO  CRANBERRY PO Take by mouth daily.    [provider]  Misc Natural Products (CRANBERRY/PROBIOTIC PO) Take by mouth daily.    [provider]  Probiotic Product ([place]) Take by mouth daily.    [provider]    Family History Family History  Problem Relation Age of Onset  . Lung cancer Maternal Grandmother     Social History Social History   Tobacco Use  . Smoking status: Never Smoker  . Smokeless tobacco: Never Used  Substance Use Topics  . Alcohol use: No  . Drug use: No     Allergies   Tramadol   Review of Systems Review of Systems  Constitutional: Negative for fever.  Gastrointestinal: Negative.   Genitourinary: Positive for dysuria, frequency and hematuria.   Physical Exam Triage Vital Signs ED Triage Vitals  Enc Vitals Group     BP  06/14/19 1307 115/83     Pulse Rate 06/14/19 1307 (!) 102     Resp 06/14/19 1307 18     Temp 06/14/19 1307 98.2 F (36.8 C)     Temp Source 06/14/19 1307 Oral     SpO2 06/14/19 1307 100 %     Weight 06/14/19 1308 190 lb (86.2 kg)     Height --      Head Circumference --      Peak Flow --      Pain Score 06/14/19 1308 4     Pain Loc --      Pain Edu? --      Excl. in [place]? --    Updated Vital Signs BP 115/83 (BP Location: Right Arm)   Pulse (!) 102   Temp 98.2 F (36.8 C) (Oral)   Resp 18   Wt 86.2 kg   LMP 01/04/2015 Comment: medicine she is on makes her not have periods. denies preg  SpO2 100%    BMI 26.50 kg/m   Visual Acuity Right Eye Distance:   Left Eye Distance:   Bilateral Distance:    Right Eye Near:   Left Eye Near:    Bilateral Near:     Physical Exam Constitutional:      General: She is not in acute distress.    Appearance: Normal appearance.  HENT:     Head: Normocephalic and atraumatic.  Eyes:     General:        Right eye: No discharge.        Left eye: No discharge.     Conjunctiva/sclera: Conjunctivae normal.  Cardiovascular:     Rate and Rhythm: Regular rhythm. Tachycardia present.  Pulmonary:     Effort: Pulmonary effort is normal.     Breath sounds: Normal breath sounds.  Abdominal:     General: There is no distension.     Palpations: Abdomen is soft.     Tenderness: There is no abdominal tenderness.  Neurological:     Mental Status: She is alert.  Psychiatric:        Behavior: Behavior normal.     Comments: Flat affect.    UC Treatments / Results  Labs (all labs ordered are listed, but only abnormal results are displayed) Labs Reviewed  URINALYSIS, COMPLETE (UACMP) WITH MICROSCOPIC - Abnormal; Notable for the following components:      Result Value   Color, Urine AMBER (*)    APPearance CLOUDY (*)    Specific Gravity, Urine >1.030 (*)    Hgb urine dipstick MODERATE (*)    Bilirubin Urine MODERATE (*)    Ketones, ur 40 (*)    Protein, ur 100 (*)    Nitrite POSITIVE (*)    Leukocytes,Ua MODERATE (*)    Bacteria, UA MANY (*)    All other components within normal limits  URINE CULTURE    EKG   Radiology No results found.  Procedures Procedures (including critical care time)  Medications Ordered in UC Medications - No data to display  Initial Impression / Assessment and Plan / UC Course  I have reviewed the triage vital signs and the nursing notes.  Pertinent labs & imaging results that were available during my care of the patient were reviewed by me and considered in my medical decision making (see chart for details).     52 year old female presents with UTI.  Placed on Keflex.  Sending culture.  Final Clinical Impressions(s) / UC Diagnoses   Final diagnoses:  Acute cystitis with hematuria     Discharge Instructions     Antibiotic as prescribed.  Take care  Dr. Lacinda Axon    ED Prescriptions    Medication Sig Dispense Auth. Provider   cephALEXin (KEFLEX) 500 MG capsule Take 1 capsule (500 mg total) by mouth 2 (two) times daily. 14 capsule Coral Spikes, DO     Controlled Substance Prescriptions  Controlled Substance Registry consulted? Not Applicable   Coral Spikes, DO 06/14/19 1407

## 2019-06-16 LAB — URINE CULTURE: Culture: >=100000 — AB

## 2019-07-08 ENCOUNTER — Telehealth: Payer: Self-pay | Admitting: Nurse Practitioner

## 2019-07-08 ENCOUNTER — Ambulatory Visit (INDEPENDENT_AMBULATORY_CARE_PROVIDER_SITE_OTHER): Payer: Medicare Other | Admitting: Nurse Practitioner

## 2019-07-08 ENCOUNTER — Other Ambulatory Visit: Payer: Self-pay

## 2019-07-08 ENCOUNTER — Encounter: Payer: Self-pay | Admitting: Nurse Practitioner

## 2019-07-08 DIAGNOSIS — E538 Deficiency of other specified B group vitamins: Secondary | ICD-10-CM

## 2019-07-08 DIAGNOSIS — Z8659 Personal history of other mental and behavioral disorders: Secondary | ICD-10-CM | POA: Diagnosis not present

## 2019-07-08 DIAGNOSIS — L299 Pruritus, unspecified: Secondary | ICD-10-CM | POA: Diagnosis not present

## 2019-07-08 DIAGNOSIS — F319 Bipolar disorder, unspecified: Secondary | ICD-10-CM | POA: Diagnosis not present

## 2019-07-08 DIAGNOSIS — R319 Hematuria, unspecified: Secondary | ICD-10-CM

## 2019-07-08 DIAGNOSIS — D72819 Decreased white blood cell count, unspecified: Secondary | ICD-10-CM | POA: Insufficient documentation

## 2019-07-08 DIAGNOSIS — D72810 Lymphocytopenia: Secondary | ICD-10-CM | POA: Diagnosis not present

## 2019-07-08 MED ORDER — CEPHALEXIN 500 MG PO CAPS: 500.0000 mg | ORAL_CAPSULE | Freq: Two times a day (BID) | ORAL | 0 refills | Status: AC

## 2019-07-08 NOTE — Telephone Encounter (Signed)
Will have patient stop in to provider outpatient urine prior to treatment.

## 2019-07-08 NOTE — Assessment & Plan Note (Signed)
Chronic, ongoing.  Followed by [facility] in [place] and obtains all psychiatric care there.  Will attempt to obtain these records.  She denies SI/HI.  Continue current medication regimen.

## 2019-07-08 NOTE — Telephone Encounter (Signed)
Patient notified

## 2019-07-08 NOTE — Patient Instructions (Signed)
Vitamin B12 Deficiency Vitamin B12 deficiency occurs when the body does not have enough vitamin B12, which is an important vitamin. The body needs this vitamin:  To make red blood cells.  To make DNA. This is the genetic material inside cells.  To help the nerves work properly so they can carry messages from the brain to the body. Vitamin B12 deficiency can cause various health problems, such as a low red blood cell count (anemia) or nerve damage. What are the causes? This condition may be caused by:  Not eating enough foods that contain vitamin B12.  Not having enough stomach acid and digestive fluids to properly absorb vitamin B12 from the food that you eat.  Certain digestive system diseases that make it hard to absorb vitamin B12. These diseases include Crohn's disease, chronic pancreatitis, and cystic fibrosis.  A condition in which the body does not make enough of a protein (intrinsic factor), resulting in too few red blood cells (pernicious anemia).  Having a surgery in which part of the stomach or small intestine is removed.  Taking certain medicines that make it hard for the body to absorb vitamin B12. These medicines include: ? Heartburn medicines (antacids and proton pump inhibitors). ? Certain antibiotic medicines. ? Some medicines that are used to treat diabetes, tuberculosis, gout, or high cholesterol. What increases the risk? The following factors may make you more likely to develop a B12 deficiency:  Being older than age 28.  Eating a vegetarian or vegan diet, especially while you are pregnant.  Eating a poor diet while you are pregnant.  Taking certain medicines.  Having alcoholism. What are the signs or symptoms? In some cases, there are no symptoms of this condition. If the condition leads to anemia or nerve damage, various symptoms can occur, such as:  Weakness.  Fatigue.  Loss of appetite.  Weight loss.  Numbness or tingling in your hands and  feet.  Redness and burning of the tongue.  Confusion or memory problems.  Depression.  Sensory problems, such as color blindness, ringing in the ears, or loss of taste.  Diarrhea or constipation.  Trouble walking. If anemia is severe, symptoms can include:  Shortness of breath.  Dizziness.  Rapid heart rate (tachycardia). How is this diagnosed? This condition may be diagnosed with a blood test to measure the level of vitamin B12 in your blood. You may also have other tests, including:  A group of tests that measure certain characteristics of blood cells (complete blood count, CBC).  A blood test to measure intrinsic factor.  A procedure where a thin tube with a camera on the end is used to look into your stomach or intestines (endoscopy). Other tests may be needed to discover the cause of B12 deficiency. How is this treated? Treatment for this condition depends on the cause. This condition may be treated by:  Changing your eating and drinking habits, such as: ? Eating more foods that contain vitamin B12. ? Drinking less alcohol or no alcohol.  Getting vitamin B12 injections.  Taking vitamin B12 supplements. Your health care provider will tell you which dosage is best for you. Follow these instructions at home: Eating and drinking   Eat lots of healthy foods that contain vitamin B12, including: ? Meats and poultry. This includes beef, pork, chicken, [place], and organ meats, such as liver. ? Seafood. This includes clams, rainbow trout, salmon, tuna, and haddock. ? Eggs. ? Cereal and dairy products that are fortified. This means that vitamin B12  has been added to the food. Check the label on the package to see if the food is fortified. The items listed above may not be a complete list of recommended foods and beverages. Contact a dietitian for more information. General instructions  Get any injections that are prescribed by your health care provider.  Take  supplements only as told by your health care provider. Follow the directions carefully.  Do not drink alcohol if your health care provider tells you not to. In some cases, you may only be asked to limit alcohol use.  Keep all follow-up visits as told by your health care provider. This is important. Contact a health care provider if:  Your symptoms come back. Get help right away if you:  Develop shortness of breath.  Have a rapid heart rate.  Have chest pain.  Become dizzy or lose consciousness. Summary  Vitamin B12 deficiency occurs when the body does not have enough vitamin B12.  The main causes of vitamin B12 deficiency include dietary deficiency, digestive diseases, pernicious anemia, and having a surgery in which part of the stomach or small intestine is removed.  In some cases, there are no symptoms of this condition. If the condition leads to anemia or nerve damage, various symptoms can occur, such as weakness, shortness of breath, and numbness.  Treatment may include getting vitamin B12 injections or taking vitamin B12 supplements. Eat lots of healthy foods that contain vitamin B12. This information is not intended to replace advice given to you by your health care provider. Make sure you discuss any questions you have with your health care provider. Document Released: 01/14/2012 Document Revised: 07/01/2018 Document Reviewed: 07/01/2018 Elsevier Patient Education  2020 [employer].  

## 2019-07-08 NOTE — Telephone Encounter (Signed)
Copied from [place] [id number]. Topic: General - Other >> Jul 08, 2019  2:49 PM Pauline Good wrote: Reason for CRM: pt need a prescription for UTI send to [facility]. Pt stated the doctor might have forgot to send it in during her phone visit.. >> Jul 08, 2019  3:23 PM [name] wrote: Pt states she got to talking and forgot to tell you of possible UTI. Pt is having a little bit of blood in her urine.  This has happened before and she was prescribed Cephalexin 500 mg 14 tabs  Can the nurse call her back when this is done On home phone

## 2019-07-08 NOTE — Assessment & Plan Note (Signed)
Appears to be ongoing issue, denies any break outs today and has been avoiding beverages that agitate this.  Will follow-up in 3 weeks and assess skin.  Has had full blood work up by previous provider.  If ongoing issues consider dermatology referral.

## 2019-07-08 NOTE — Telephone Encounter (Signed)
Due to patient with transportation issues, will send in Keflex at this time.  But if ongoing symptoms she is to return to office for urine sample immediately.

## 2019-07-08 NOTE — Assessment & Plan Note (Signed)
Chronic, ongoing.  Followed by [facility] in [place] and obtains all psychiatric care there.  Will attempt to obtain these records.  History of tardive dyskinesia.  She denies SI/HI.  Continue current medication regimen.

## 2019-07-08 NOTE — Telephone Encounter (Signed)
Patient rides the transportation bus so it will take at least 3 days, she states that she is having some pain and blood in her urine.

## 2019-07-08 NOTE — Addendum Note (Signed)
Addended by: Marnee Guarneri T on: 07/08/2019 05:00 PM   Modules accepted: Orders

## 2019-07-08 NOTE — Assessment & Plan Note (Signed)
Chronic, followed by hematology.  Recommendation to monitor CBC twice a year, last performed in July 2020.  Will repeat CBC in January and collaborate with hematology.

## 2019-07-08 NOTE — Assessment & Plan Note (Signed)
With normal, low levels.  Plan to repeat these in 3 weeks.  Have recommend daily supplement, Vitamin B12 1000 MCG daily.

## 2019-07-08 NOTE — Progress Notes (Signed)
New Patient Office Visit  Subjective:  Patient ID: Tina Clark, female    DOB: 1965/12/07  Age: 53 y.o. MRN: [id number]  CC:  Chief Complaint  Patient presents with  . Establish Care    . This visit was completed via telephone due to the restrictions of the COVID-19 pandemic. All issues as above were discussed and addressed but no physical exam was performed. If it was felt that the patient should be evaluated in the office, they were directed there. The patient verbally consented to this visit. Patient was unable to complete an audio/visual visit due to Lack of equipment. Due to the catastrophic nature of the COVID-19 pandemic, this visit was done through audio contact only. . Location of the patient: home . Location of the provider: home . Those involved with this call:  . Provider: Marnee Guarneri, DNP . CMA: Yvonna Alanis, CMA . Front Desk/Registration: Jill Side  . Time spent on call: 30 minutes on the phone discussing health concerns. 15 minutes total spent in review of patient's record and preparation of their chart.  . I verified patient identity using two factors (patient name and date of birth). Patient consents verbally to being seen via telemedicine visit today.    HPI Tina Clark presents for new patient visit to establish care.  Introduced to Designer, jewellery role and practice setting.  All questions answered.  Of note on review chart she established new patient care at [facility] with PA Whitaker 02/04/2019 and then at [facility] with NP Carlis Abbott 05/07/2019.  She is also followed by [facility] and obtains annual physicals and psychiatric care there.  Reports she gets pap smears, breast exams, and Vitamin D at the [place].  Served in [employer] for 4 years.  She sees psychiatry at [facility] every other month.  Discussed with her multiple establishment of care in community at different clinics, she was very vague on this stating that her main care is at [place], but she  likes "people in community for other stuff".    PEELING/WHITE SKIN: States she gets patches of white on her arms and bilateral legs, skin peels.  States like she is itching from the inside when this happens, but not on outside.  Notices this occurs with drinking soda (the worst with these), juices, tea, milk, lemonade. None of these symptoms present when she drinks water.  Has been going on for about 3 years.  Currently is using soft soap, does not use Dial states it does not get dry skin off.  Raw sugar scrub, [place], and moisture blast deodorant soap helps get the dry skin off.  Not taking any OTC medications to assist with this.  Does not use any lotions.  Has had at length work-up by previous NP and hematology (06/04/19).  Was noted to have chronic leukopenia since at least 2015, with predominant lymphocytopenia.  Recommendations per hematology were to continue to observe and CBC twice a year.  She was also noted to have a low normal B12 level and recommended to take daily B12, which she states she has not started. Duration: about 3 years now Location: legs, arms, and sometimes on face/forehead (dry/patchy peeling skin) Pain: none Redness: no Swelling: no Oozing: no Pus: no Fevers: no Nausea/vomiting: no Status: fluctuating Treatments attempted: none  BIPOLAR AND SCHIZOPHRENIA Followed by psychiatry at [facility].  Continues on Haldol 175 MG every 28 days and Cogentin 2 MG daily.  Has a history of having issues  with tardive dyskinesia.  Her schizophrenia includes auditory hallucinations in past.  Has psychiatry visit this month.   Mood status: stable Satisfied with current treatment?: yes Symptom severity: mild  Duration of current treatment : chronic Side effects: no Medication compliance: good compliance Psychotherapy/counseling: yes current Previous psychiatric medications: multiple medications  Depressed mood: no Anxious mood: no Anhedonia: no Significant weight loss or gain:  no Insomnia: yes hard to fall asleep Fatigue: no Feelings of worthlessness or guilt: no Impaired concentration/indecisiveness: no Suicidal ideations: no Hopelessness: no Crying spells: no Depression screen [facility] 2/9 07/08/2019  Decreased Interest 0  Down, Depressed, Hopeless 0  PHQ - 2 Score 0  Altered sleeping 1  Tired, decreased energy 2  Change in appetite 0  Feeling bad or failure about yourself  0  Trouble concentrating 1  Moving slowly or fidgety/restless 0  Suicidal thoughts 0  PHQ-9 Score 4  Difficult doing work/chores Not difficult at all   GAD 7 : Generalized Anxiety Score 07/08/2019  Nervous, Anxious, on Edge 0  Control/stop worrying 0  Worry too much - different things 0  Trouble relaxing 1  Restless 0  Easily annoyed or irritable 0  Afraid - awful might happen 0  Total GAD 7 Score 1  Anxiety Difficulty Not difficult at all    Past Medical History:  Diagnosis Date  . Anemia   . Bipolar disorder ([place])   . History of colonic polyps   . Mental impairment   . Schizophrenia, unspecified ([place])   . Seasonal allergies   . UTI (urinary tract infection)     Past Surgical History:  Procedure Laterality Date  . COLONOSCOPY WITH PROPOFOL N/A 12/06/2017   Procedure: COLONOSCOPY WITH PROPOFOL;  Surgeon: Lucilla Lame, MD;  Location: [place];  Service: Endoscopy;  Laterality: N/A;  . POLYPECTOMY  12/06/2017   Procedure: POLYPECTOMY;  Surgeon: Lucilla Lame, MD;  Location: [place];  Service: Endoscopy;;    Family History  Problem Relation Age of Onset  . Lung cancer Maternal Grandmother   . Arthritis Sister     Social History   Socioeconomic History  . Marital status: Single    Spouse name: Not on file  . Number of children: 0  . Years of education: Not on file  . Highest education level: Not on file  Occupational History  . Not on file  Social Needs  . Financial resource strain: Not on file  . Food insecurity    Worry: Not on file     Inability: Not on file  . Transportation needs    Medical: Not on file    Non-medical: Not on file  Tobacco Use  . Smoking status: Never Smoker  . Smokeless tobacco: Never Used  Substance and Sexual Activity  . Alcohol use: No  . Drug use: No  . Sexual activity: Not on file  Lifestyle  . Physical activity    Days per week: Not on file    Minutes per session: Not on file  . Stress: Not on file  Relationships  . Social Herbalist on phone: Not on file    Gets together: Not on file    Attends religious service: Not on file    Active member of club or organization: Not on file    Attends meetings of clubs or organizations: Not on file    Relationship status: Not on file  . Intimate partner violence    Fear of current or ex partner:  Not on file    Emotionally abused: Not on file    Physically abused: Not on file    Forced sexual activity: Not on file  Other Topics Concern  . Not on file  Social History Narrative   Single.   No children.   On disability.     ROS Review of Systems  Constitutional: Negative for activity change, appetite change, diaphoresis, fatigue and fever.  Respiratory: Negative for cough, chest tightness and shortness of breath.   Cardiovascular: Negative for chest pain, palpitations and leg swelling.  Gastrointestinal: Negative for abdominal distention, abdominal pain, constipation, diarrhea, nausea and vomiting.  Endocrine: Negative for cold intolerance, heat intolerance, polydipsia, polyphagia and polyuria.  Skin: Positive for color change.  Neurological: Negative for dizziness, syncope, weakness, light-headedness, numbness and headaches.  Psychiatric/Behavioral: Negative.     Objective:   Today's Vitals: LMP 01/04/2015 Comment: medicine she is on makes her not have periods. denies preg  Physical Exam   Unable to perform due to telephone only visit  Assessment & Plan:   Problem List Items Addressed This Visit      Musculoskeletal  and Integument   Pruritus    Appears to be ongoing issue, denies any break outs today and has been avoiding beverages that agitate this.  Will follow-up in 3 weeks and assess skin.  Has had full blood work up by previous provider.  If ongoing issues consider dermatology referral.        Other   Bipolar 1 disorder ([place]) - Primary    Chronic, ongoing.  Followed by [facility] in [place] and obtains all psychiatric care there.  Will attempt to obtain these records.  She denies SI/HI.  Continue current medication regimen.      History of schizophrenia    Chronic, ongoing.  Followed by [facility] in [place] and obtains all psychiatric care there.  Will attempt to obtain these records.  History of tardive dyskinesia.  She denies SI/HI.  Continue current medication regimen.      Leukopenia    Chronic, followed by hematology.  Recommendation to monitor CBC twice a year, last performed in July 2020.  Will repeat CBC in January and collaborate with hematology.      B12 deficiency    With normal, low levels.  Plan to repeat these in 3 weeks.  Have recommend daily supplement, Vitamin B12 1000 MCG daily.           Outpatient Encounter Medications as of 07/08/2019  Medication Sig  . benztropine (COGENTIN) 2 MG tablet Take 2 mg by mouth daily.   . Cholecalciferol (VITAMIN D-3) 1000 units CAPS Take 2,000 Units by mouth daily.  . haloperidol decanoate (HALDOL DECANOATE) 50 MG/ML injection Inject 175 mg into the muscle every 28 (twenty-eight) days.   . vitamin B-12 (CYANOCOBALAMIN) 1000 MCG tablet Take 1 tablet (1,000 mcg total) by mouth daily.  . [DISCONTINUED] cephALEXin (KEFLEX) 500 MG capsule Take 1 capsule (500 mg total) by mouth 2 (two) times daily.  . [DISCONTINUED] CRANBERRY PO Take by mouth daily.  . [DISCONTINUED] Misc Natural Products (CRANBERRY/PROBIOTIC PO) Take by mouth daily.  . [DISCONTINUED] Probiotic Product ([place]) Take by mouth daily.  . [DISCONTINUED] ST JOHNS WORT PO Take by  mouth.   No facility-administered encounter medications on file as of 07/08/2019.    I discussed the assessment and treatment plan with the patient. The patient was provided an opportunity to ask questions and all were answered. The patient agreed with the  plan and demonstrated an understanding of the instructions.   The patient was advised to call back or seek an in-person evaluation if the symptoms worsen or if the condition fails to improve as anticipated.   I provided 30 minutes of time during this encounter.  Follow-up: Return in about 3 weeks (around 07/29/2019) for Follow-up in person for skin and mood (30 minutes).   Venita Lick, NP

## 2019-07-17 ENCOUNTER — Ambulatory Visit (INDEPENDENT_AMBULATORY_CARE_PROVIDER_SITE_OTHER): Payer: Medicare Other | Admitting: Nurse Practitioner

## 2019-07-17 ENCOUNTER — Other Ambulatory Visit: Payer: Self-pay

## 2019-07-17 ENCOUNTER — Encounter: Payer: Self-pay | Admitting: Nurse Practitioner

## 2019-07-17 VITALS — BP 106/73 | HR 78 | Temp 98.2°F

## 2019-07-17 DIAGNOSIS — E538 Deficiency of other specified B group vitamins: Secondary | ICD-10-CM | POA: Diagnosis not present

## 2019-07-17 DIAGNOSIS — D72810 Lymphocytopenia: Secondary | ICD-10-CM | POA: Diagnosis not present

## 2019-07-17 DIAGNOSIS — Z8659 Personal history of other mental and behavioral disorders: Secondary | ICD-10-CM

## 2019-07-17 DIAGNOSIS — L299 Pruritus, unspecified: Secondary | ICD-10-CM

## 2019-07-17 DIAGNOSIS — F319 Bipolar disorder, unspecified: Secondary | ICD-10-CM | POA: Diagnosis not present

## 2019-07-17 MED ORDER — SHINGRIX 50 MCG/0.5ML IM SUSR: 0.5000 mL | Freq: Once | INTRAMUSCULAR | 0 refills | Status: AC

## 2019-07-17 NOTE — Assessment & Plan Note (Signed)
Chronic, ongoing.  Followed by [facility] in [place] and obtains all psychiatric care there.  Will attempt to obtain these records.  She denies SI/HI.  Continue current medication regimen.

## 2019-07-17 NOTE — Patient Instructions (Addendum)

## 2019-07-17 NOTE — Assessment & Plan Note (Signed)
No flares today.  Reviewed photos patient presented and on appearance presents like an atopic dermatitis, question whether the specific items she discusses are causing flares.  She refuses any steroid cream script at this time, wishes to continue her focus on diet changes which has been helping.  Recommended she return to office immediately if continued or worsening flares present.

## 2019-07-17 NOTE — Assessment & Plan Note (Signed)
Will check CBC today and then plan on every 6 months CBC checks per recommendations.  Return to hematology as scheduled.

## 2019-07-17 NOTE — Progress Notes (Signed)
BP 106/73   Pulse 78   Temp 98.2 F (36.8 C) (Oral)   LMP 01/04/2015 Comment: medicine she is on makes her not have periods. denies preg  SpO2 96%    Subjective:    Patient ID: Tina Clark, female    DOB: 02/06/1966, 53 y.o.   MRN: [id number]  HPI: Tina Clark is a 53 y.o. female  Chief Complaint  Patient presents with  . Manic Behavior   BIPOLAR DISORDER AND SCHIZOPHRENIA Followed by [facility] psychiatry.  Continues on Haldol 175 MG every 28 days and Cogentin 2 MG daily.  Has a history of having issues with tardive dyskinesia when she had been on higher doses, they are working on reducing this down. Her schizophrenia includes auditory hallucinations in past.  Has psychiatry visit this month, is to set up appointment.   Mood status: stable Satisfied with current treatment?: yes Symptom severity: mild  Duration of current treatment : chronic Side effects: no Medication compliance: good compliance Psychotherapy/counseling: yes in the past Depressed mood: no Anxious mood: no Anhedonia: no Significant weight loss or gain: no Insomnia: none Fatigue: no Feelings of worthlessness or guilt: no Impaired concentration/indecisiveness: no Suicidal ideations: no Hopelessness: no Crying spells: no Depression screen [facility] 2/9 07/17/2019 07/08/2019  Decreased Interest 0 0  Down, Depressed, Hopeless 0 0  PHQ - 2 Score 0 0  Altered sleeping 1 1  Tired, decreased energy 1 2  Change in appetite 0 0  Feeling bad or failure about yourself  0 0  Trouble concentrating 0 1  Moving slowly or fidgety/restless 0 0  Suicidal thoughts 0 0  PHQ-9 Score 2 4  Difficult doing work/chores Not difficult at all Not difficult at all   B12 DEFICIENCY: Last level 271, continues on daily supplement.  Reports this has improved her fatigue.  Fatigue: no Decreased exercise tolerance: no  Dyspnea on exertion: no Palpitations: no Bleeding: no Pica: no   FLAKY SKIN: States she gets patches of  white on her arms and bilateral legs per her report. States like she is itching from the inside when this happens, but not on outside. Notices this occurs with drinking soda (the worst with these), juices, tea, milk, lemonade. None of these symptoms present when she drinks water.  Has been ongoing for about 3 years.  Currently is using soft soap, does not use Dial states it does not get dry skin off.  Raw sugar scrub, [place], and moisture blast deodorant soap helps get the dry skin off.  Not taking any OTC medications to assist with this.  Does not use any lotions.  Has had at length work-up by previous NP and hematology (06/04/19).  Was noted to have chronic leukopenia since at least 2015, with predominant lymphocytopenia.  Recommendations per hematology were to continue to observe and CBC twice a year.   Duration: about 3 years now Location: legs, arms, and sometimes on face/forehead (dry/patchy peeling skin) Pain: none Redness: no Swelling: no Oozing: no Pus: no Fevers: no Nausea/vomiting: no Status: fluctuating Treatments attempted: none  Relevant past medical, surgical, family and social history reviewed and updated as indicated. Interim medical history since our last visit reviewed. Allergies and medications reviewed and updated.  Review of Systems  Constitutional: Negative for activity change, appetite change, diaphoresis, fatigue and fever.  Respiratory: Negative for cough, chest tightness and shortness of breath.   Cardiovascular: Negative for chest pain, palpitations and leg swelling.  Gastrointestinal: Negative for abdominal distention, abdominal  pain, constipation, diarrhea, nausea and vomiting.  Endocrine: Negative for cold intolerance, heat intolerance, polydipsia, polyphagia and polyuria.  Neurological: Negative for dizziness, syncope, weakness, light-headedness, numbness and headaches.  Psychiatric/Behavioral: Negative.     Per HPI unless specifically indicated above      Objective:    BP 106/73   Pulse 78   Temp 98.2 F (36.8 C) (Oral)   LMP 01/04/2015 Comment: medicine she is on makes her not have periods. denies preg  SpO2 96%   Wt Readings from Last 3 Encounters:  06/14/19 190 lb (86.2 kg)  06/04/19 190 lb 7 oz (86.4 kg)  05/20/19 193 lb 3 oz (87.6 kg)    Physical Exam Vitals signs and nursing note reviewed.  Constitutional:      General: She is awake. She is not in acute distress.    Appearance: She is well-developed. She is not ill-appearing.  HENT:     Head: Normocephalic.     Right Ear: Hearing normal.     Left Ear: Hearing normal.  Eyes:     General: Lids are normal.        Right eye: No discharge.        Left eye: No discharge.     Conjunctiva/sclera: Conjunctivae normal.     Pupils: Pupils are equal, round, and reactive to light.  Neck:     Musculoskeletal: Normal range of motion and neck supple.     Thyroid: No thyromegaly.     Vascular: No carotid bruit.  Cardiovascular:     Rate and Rhythm: Normal rate and regular rhythm.     Heart sounds: Normal heart sounds. No murmur. No gallop.   Pulmonary:     Effort: Pulmonary effort is normal. No accessory muscle usage or respiratory distress.     Breath sounds: Normal breath sounds.  Abdominal:     General: Bowel sounds are normal.     Palpations: Abdomen is soft. There is no hepatomegaly or splenomegaly.     Tenderness: There is no abdominal tenderness.  Musculoskeletal:     Right lower leg: No edema.     Left lower leg: No edema.  Lymphadenopathy:     Cervical: No cervical adenopathy.  Skin:    General: Skin is warm and dry.     Comments: No current rashes to skin.  She did show provider pictures of when skin has "flares".  Noted white, dry, flaky appearing skin to ankles bilaterally and feet + some small, dry, white, flaky patches to legs.  No erythema or peeling of skin noted to areas.  No drainage noted.  Neurological:     Mental Status: She is alert and oriented to  person, place, and time.  Psychiatric:        Attention and Perception: Attention normal.        Mood and Affect: Mood normal.        Behavior: Behavior normal. Behavior is cooperative.        Thought Content: Thought content normal.        Judgment: Judgment normal.     Results for orders placed or performed during the hospital encounter of 06/14/19  Urine Culture   Specimen: Urine, Clean Catch  Result Value Ref Range   Specimen Description      URINE, CLEAN CATCH Performed at [facility], [address], [place], [place] [address]    Special Requests      NONE Performed at [facility], [place]  [address] [place], [place] [address]    Culture >=100,000 COLONIES/mL ESCHERICHIA COLI (A)    Report Status 06/16/2019 FINAL    Organism ID, Bacteria ESCHERICHIA COLI (A)       Susceptibility   Escherichia coli - MIC*    AMPICILLIN <=2 SENSITIVE Sensitive     CEFAZOLIN <=4 SENSITIVE Sensitive     CEFTRIAXONE <=1 SENSITIVE Sensitive     CIPROFLOXACIN <=0.25 SENSITIVE Sensitive     GENTAMICIN <=1 SENSITIVE Sensitive     IMIPENEM <=0.25 SENSITIVE Sensitive     NITROFURANTOIN <=16 SENSITIVE Sensitive     TRIMETH/SULFA <=20 SENSITIVE Sensitive     AMPICILLIN/SULBACTAM <=2 SENSITIVE Sensitive     PIP/TAZO <=4 SENSITIVE Sensitive     Extended ESBL NEGATIVE Sensitive     * >=100,000 COLONIES/mL ESCHERICHIA COLI  Urinalysis, Complete w Microscopic  Result Value Ref Range   Color, Urine AMBER (A) YELLOW   APPearance CLOUDY (A) CLEAR   Specific Gravity, Urine >1.030 (H) 1.005 - 1.030   pH 5.0 5.0 - 8.0   Glucose, UA NEGATIVE NEGATIVE mg/dL   Hgb urine dipstick MODERATE (A) NEGATIVE   Bilirubin Urine MODERATE (A) NEGATIVE   Ketones, ur 40 (A) NEGATIVE mg/dL   Protein, ur 100 (A) NEGATIVE mg/dL   Nitrite POSITIVE (A) NEGATIVE   Leukocytes,Ua MODERATE (A) NEGATIVE   Squamous Epithelial / LPF 11-20 0 - 5   WBC, UA >50 0 - 5 WBC/hpf   RBC / HPF 11-20 0 - 5  RBC/hpf   Bacteria, UA MANY (A) NONE SEEN      Assessment & Plan:   Problem List Items Addressed This Visit      Musculoskeletal and Integument   Pruritus    No flares today.  Reviewed photos patient presented and on appearance presents like an atopic dermatitis, question whether the specific items she discusses are causing flares.  She refuses any steroid cream script at this time, wishes to continue her focus on diet changes which has been helping.  Recommended she return to office immediately if continued or worsening flares present.      Relevant Orders   VITAMIN D 25 Hydroxy (Vit-D Deficiency, Fractures)     Other   Bipolar 1 disorder ([facility]) - Primary    Chronic, ongoing.  Followed by [facility] in [place] and obtains all psychiatric care there.  Will attempt to obtain these records.  History of tardive dyskinesia.  She denies SI/HI.  Continue current medication regimen and collaboration with psych at [facility].      History of schizophrenia    Chronic, ongoing.  Followed by [facility] in [place] and obtains all psychiatric care there.  Will attempt to obtain these records.  She denies SI/HI.  Continue current medication regimen.      Leukopenia    Will check CBC today and then plan on every 6 months CBC checks per recommendations.  Return to hematology as scheduled.      B12 deficiency    Check CBC and B12 level today, continue supplement.      Relevant Orders   CBC with Differential/Platelet   Vitamin B12       Follow up plan: Return in about 3 months (around 10/16/2019) for Mood follow-up.

## 2019-07-17 NOTE — Assessment & Plan Note (Signed)
Check CBC and B12 level today, continue supplement.

## 2019-07-17 NOTE — Assessment & Plan Note (Signed)
Chronic, ongoing.  Followed by [facility] in [place] and obtains all psychiatric care there.  Will attempt to obtain these records.  History of tardive dyskinesia.  She denies SI/HI.  Continue current medication regimen and collaboration with psych at [facility].

## 2019-07-18 ENCOUNTER — Telehealth: Payer: Self-pay | Admitting: Primary Care

## 2019-07-18 LAB — CBC WITH DIFFERENTIAL/PLATELET
Basophils Absolute: 0 10*3/uL (ref 0.0–0.2)
Basos: 0 %
EOS (ABSOLUTE): 0 10*3/uL (ref 0.0–0.4)
Eos: 0 %
Hematocrit: 40.2 % (ref 34.0–46.6)
Hemoglobin: 13.4 g/dL (ref 11.1–15.9)
Immature Grans (Abs): 0 10*3/uL (ref 0.0–0.1)
Immature Granulocytes: 0 %
Lymphocytes Absolute: 0.6 10*3/uL — ABNORMAL LOW (ref 0.7–3.1)
Lymphs: 16 %
MCH: 30.1 pg (ref 26.6–33.0)
MCHC: 33.3 g/dL (ref 31.5–35.7)
MCV: 90 fL (ref 79–97)
Monocytes Absolute: 0.4 10*3/uL (ref 0.1–0.9)
Monocytes: 10 %
Neutrophils Absolute: 2.9 10*3/uL (ref 1.4–7.0)
Neutrophils: 74 %
Platelets: 156 10*3/uL (ref 150–450)
RBC: 4.45 x10E6/uL (ref 3.77–5.28)
RDW: 12.7 % (ref 11.7–15.4)
WBC: 4 10*3/uL (ref 3.4–10.8)

## 2019-07-18 LAB — VITAMIN D 25 HYDROXY (VIT D DEFICIENCY, FRACTURES): Vit D, 25-Hydroxy: 32.1 ng/mL (ref 30.0–100.0)

## 2019-07-18 LAB — VITAMIN B12: Vitamin B-12: 1031 pg/mL (ref 232–1245)

## 2019-10-15 ENCOUNTER — Ambulatory Visit
Admission: EM | Admit: 2019-10-15 | Discharge: 2019-10-15 | Disposition: A | Discharge status: Home / Self Care | Payer: Medicare Other | Attending: Urgent Care | Admitting: Urgent Care

## 2019-10-15 ENCOUNTER — Other Ambulatory Visit: Payer: Self-pay

## 2019-10-15 ENCOUNTER — Encounter: Payer: Self-pay | Admitting: Emergency Medicine

## 2019-10-15 DIAGNOSIS — R300 Dysuria: Secondary | ICD-10-CM | POA: Diagnosis not present

## 2019-10-15 LAB — URINALYSIS, COMPLETE (UACMP) WITH MICROSCOPIC
Bilirubin Urine: NEGATIVE
Glucose, UA: NEGATIVE mg/dL
Ketones, ur: NEGATIVE mg/dL
Nitrite: NEGATIVE
Protein, ur: NEGATIVE mg/dL
Specific Gravity, Urine: 1.02 (ref 1.005–1.030)
pH: 8.5 — ABNORMAL HIGH (ref 5.0–8.0)

## 2019-10-15 MED ORDER — NITROFURANTOIN MONOHYD MACRO 100 MG PO CAPS: 100.0000 mg | ORAL_CAPSULE | Freq: Two times a day (BID) | ORAL | 0 refills | Status: DC

## 2019-10-15 NOTE — Discharge Instructions (Addendum)
Take medication as prescribed. Rest. Drink plenty of fluids.  ° °Follow up with your primary care physician this week as needed. Return to Urgent care for new or worsening concerns.  ° °

## 2019-10-15 NOTE — ED Triage Notes (Signed)
Patient in today c/o urinary frequency and left lower abdominal pain since yesterday. Patient denies fever. Patient has not tried any OTC medications.

## 2019-10-15 NOTE — ED Provider Notes (Signed)
[facility] ____________________________________________  Time seen: Approximately 7:24 PM  I have reviewed the triage vital signs and the nursing notes.   HISTORY  Chief Complaint Urinary Frequency and Abdominal Pain   HPI Tina Clark is a 53 y.o. female presenting for evaluation of urinary frequency and urinary urgency.  Denies burning with urination, flank pain, vomiting, diarrhea.  Reports yesterday did have one brief episode of left lower abdominal discomfort, but reports that resolved after having a bowel movement.  States currently feels consistent like previous urinary infections.  Denies fevers.  Has continued to eat and drink well.  Denies other aggravating alleviating factors.  Venita Lick, NP : PCP   Past Medical History:  Diagnosis Date  . Anemia   . Bipolar disorder ([place])   . History of colonic polyps   . Mental impairment   . Schizophrenia, unspecified ([place])   . Seasonal allergies   . UTI (urinary tract infection)     Patient Active Problem List   Diagnosis Date Noted  . Leukopenia 07/08/2019  . B12 deficiency 07/08/2019  . Pruritus 05/07/2019  . Bipolar 1 disorder ([place]) 02/04/2019  . History of schizophrenia 02/04/2019  . Special screening for malignant neoplasms, colon   . Benign neoplasm of ascending colon   . Polyp of sigmoid colon   . Acute pain of left knee 08/14/2017  . Effusion of left knee 08/14/2017    Past Surgical History:  Procedure Laterality Date  . COLONOSCOPY WITH PROPOFOL N/A 12/06/2017   Procedure: COLONOSCOPY WITH PROPOFOL;  Surgeon: Lucilla Lame, MD;  Location: [place];  Service: Endoscopy;  Laterality: N/A;  . POLYPECTOMY  12/06/2017   Procedure: POLYPECTOMY;  Surgeon: Lucilla Lame, MD;  Location: [place];  Service: Endoscopy;;     No current facility-administered medications for this encounter.  Current Outpatient Medications:  .  benztropine (COGENTIN) 2 MG tablet, Take 2 mg by  mouth daily. , Disp: , Rfl:  .  Cholecalciferol (VITAMIN D-3) 1000 units CAPS, Take 2,000 Units by mouth daily., Disp: , Rfl:  .  haloperidol decanoate (HALDOL DECANOATE) 50 MG/ML injection, Inject 175 mg into the muscle every 28 (twenty-eight) days. , Disp: , Rfl:  .  UNABLE TO FIND, Focus Factor, Disp: , Rfl:  .  vitamin B-12 (CYANOCOBALAMIN) 1000 MCG tablet, Take 1 tablet (1,000 mcg total) by mouth daily., Disp: 90 tablet, Rfl: 1 .  nitrofurantoin, macrocrystal-monohydrate, (MACROBID) 100 MG capsule, Take 1 capsule (100 mg total) by mouth 2 (two) times daily., Disp: 10 capsule, Rfl: 0 .  [START ON 01/14/2020] Zoster Vaccine Adjuvanted [facility]) injection, Inject 0.5 mLs into the muscle once for 1 dose. Shot # 2., Disp: 0.5 mL, Rfl: 0  Allergies Tramadol  Family History  Problem Relation Age of Onset  . Hypertension Mother   . Other Father        unknown medical history  . Lung cancer Maternal Grandmother   . Arthritis Sister     Social History Social History   Tobacco Use  . Smoking status: Never Smoker  . Smokeless tobacco: Never Used  Substance Use Topics  . Alcohol use: No  . Drug use: No    Review of Systems Constitutional: No fever ENT: No sore throat. Cardiovascular: Denies chest pain. Respiratory: Denies shortness of breath. Gastrointestinal: No current abdominal pain.  No nausea, no vomiting.  No diarrhea.   Genitourinary: Positive for dysuria. Musculoskeletal: Negative for back pain. Skin: Negative for rash.   ____________________________________________  PHYSICAL EXAM:  VITAL SIGNS: ED Triage Vitals  Enc Vitals Group     BP 10/15/19 1849 130/77     Pulse Rate 10/15/19 1849 69     Resp 10/15/19 1849 18     Temp 10/15/19 1849 98.6 F (37 C)     Temp Source 10/15/19 1849 Oral     SpO2 10/15/19 1849 99 %     Weight 10/15/19 1852 210 lb (95.3 kg)     Height 10/15/19 1852 5' 11.25" (1.81 m)     Head Circumference --      Peak Flow --      Pain  Score 10/15/19 1852 0     Pain Loc --      Pain Edu? --      Excl. in [place]? --     Constitutional: Alert and oriented. Well appearing and in no acute distress. Eyes: Conjunctivae are normal.  ENT      Head: Normocephalic and atraumatic. Cardiovascular: Normal rate, regular rhythm. Grossly normal heart sounds.  Good peripheral circulation. Respiratory: Normal respiratory effort without tachypnea nor retractions. Breath sounds are clear and equal bilaterally. No wheezes, rales, rhonchi. Gastrointestinal: Soft and nontender. Normal Bowel sounds. No CVA tenderness. Musculoskeletal: No midline cervical, thoracic or lumbar tenderness to palpation.  Neurologic:  Normal speech and language. Speech is normal. No gait instability.  Skin:  Skin is warm, dry and intact. No rash noted. Psychiatric: Mood and affect are normal. Speech and behavior are normal. Patient exhibits appropriate insight and judgment   ___________________________________________   LABS (all labs ordered are listed, but only abnormal results are displayed)  Labs Reviewed  URINALYSIS, COMPLETE (UACMP) WITH MICROSCOPIC - Abnormal; Notable for the following components:      Result Value   APPearance HAZY (*)    pH 8.5 (*)    Hgb urine dipstick TRACE (*)    Leukocytes,Ua LARGE (*)    Bacteria, UA MANY (*)    All other components within normal limits  URINE CULTURE    PROCEDURES Procedures   INITIAL IMPRESSION / ASSESSMENT AND PLAN / ED COURSE  Pertinent labs & imaging results that were available during my care of the patient were reviewed by me and considered in my medical decision making (see chart for details).  Well-appearing patient.  No acute distress.  Abdomen soft and nontender.  Urinalysis reviewed, concern for UTI.  We will culture and empirically treat with oral Macrobid.  Encourage rest, fluids, supportive care.Discussed indication, risks and benefits of medications with patient.   Discussed follow up with  Primary care physician this week. Discussed follow up and return parameters including no resolution or any worsening concerns. Patient verbalized understanding and agreed to plan.   ____________________________________________   FINAL CLINICAL IMPRESSION(S) / ED DIAGNOSES  Final diagnoses:  Dysuria     ED Discharge Orders         Ordered    nitrofurantoin, macrocrystal-monohydrate, (MACROBID) 100 MG capsule  2 times daily     10/15/19 1920           Note: This dictation was prepared with Dragon dictation along with smaller phrase technology. Any transcriptional errors that result from this process are unintentional.         Marylene Land, NP 10/15/19 1943

## 2019-10-16 ENCOUNTER — Ambulatory Visit: Payer: Medicare Other | Admitting: Nurse Practitioner

## 2019-10-18 LAB — URINE CULTURE: Culture: 10000 — AB

## 2019-11-30 ENCOUNTER — Other Ambulatory Visit: Payer: No Typology Code available for payment source

## 2019-12-02 ENCOUNTER — Ambulatory Visit: Payer: No Typology Code available for payment source | Admitting: Oncology

## 2020-01-07 ENCOUNTER — Ambulatory Visit (INDEPENDENT_AMBULATORY_CARE_PROVIDER_SITE_OTHER): Payer: Medicare Other

## 2020-01-07 DIAGNOSIS — Z0000 Encounter for general adult medical examination without abnormal findings: Secondary | ICD-10-CM

## 2020-01-07 NOTE — Patient Instructions (Signed)
Tina Clark , Thank you for taking time to come for your Medicare Wellness Visit. I appreciate your ongoing commitment to your health goals. Please review the following plan we discussed and let me know if I can assist you in the future.   Screening recommendations/referrals: Colonoscopy: up to date Mammogram:completed at [facility] Bone Density: not indicated  Recommended yearly ophthalmology/optometry visit for glaucoma screening and checkup Recommended yearly dental visit for hygiene and checkup  Vaccinations: Influenza vaccine: declined  Pneumococcal vaccine: not indicated  Tdap vaccine: up to date  Shingles vaccine: shingrix eligible, check with [facility]    Advanced directives: Advance directive discussed with you today. Once this is complete please bring a copy in to our office so we can scan it into your chart.  Conditions/risks identified: none   Next appointment: Follow up in one year for your annual wellness visit   Preventive Care 40-64 Years, Female Preventive care refers to lifestyle choices and visits with your health care provider that can promote health and wellness. What does preventive care include?  A yearly physical exam. This is also called an annual well check.  Dental exams once or twice a year.  Routine eye exams. Ask your health care provider how often you should have your eyes checked.  Personal lifestyle choices, including:  Daily care of your teeth and gums.  Regular physical activity.  Eating a healthy diet.  Avoiding tobacco and drug use.  Limiting alcohol use.  Practicing safe sex.  Taking low-dose aspirin daily starting at age 59.  Taking vitamin and mineral supplements as recommended by your health care provider. What happens during an annual well check? The services and screenings done by your health care provider during your annual well check will depend on your age, overall health, lifestyle risk factors, and family history of disease. Counseling   Your health care provider may ask you questions about your:  Alcohol use.  Tobacco use.  Drug use.  Emotional well-being.  Home and relationship well-being.  Sexual activity.  Eating habits.  Work and work Statistician.  Method of birth control.  Menstrual cycle.  Pregnancy history. Screening  You may have the following tests or measurements:  Height, weight, and BMI.  Blood pressure.  Lipid and cholesterol levels. These may be checked every 5 years, or more frequently if you are over 64 years old.  Skin check.  Lung cancer screening. You may have this screening every year starting at age 54 if you have a 30-pack-year history of smoking and currently smoke or have quit within the past 15 years.  Fecal occult blood test (FOBT) of the stool. You may have this test every year starting at age 33.  Flexible sigmoidoscopy or colonoscopy. You may have a sigmoidoscopy every 5 years or a colonoscopy every 10 years starting at age 56.  Hepatitis C blood test.  Hepatitis B blood test.  Sexually transmitted disease (STD) testing.  Diabetes screening. This is done by checking your blood sugar (glucose) after you have not eaten for a while (fasting). You may have this done every 1-3 years.  Mammogram. This may be done every 1-2 years. Talk to your health care provider about when you should start having regular mammograms. This may depend on whether you have a family history of breast cancer.  BRCA-related cancer screening. This may be done if you have a family history of breast, ovarian, tubal, or peritoneal cancers.  Pelvic exam and Pap test. This may be done every 3 years  starting at age 45. Starting at age 61, this may be done every 5 years if you have a Pap test in combination with an HPV test.  Bone density scan. This is done to screen for osteoporosis. You may have this scan if you are at high risk for osteoporosis. Discuss your test results, treatment options, and if  necessary, the need for more tests with your health care provider. Vaccines  Your health care provider may recommend certain vaccines, such as:  Influenza vaccine. This is recommended every year.  Tetanus, diphtheria, and acellular pertussis (Tdap, Td) vaccine. You may need a Td booster every 10 years.  Zoster vaccine. You may need this after age 31.  Pneumococcal 13-valent conjugate (PCV13) vaccine. You may need this if you have certain conditions and were not previously vaccinated.  Pneumococcal polysaccharide (PPSV23) vaccine. You may need one or two doses if you smoke cigarettes or if you have certain conditions. Talk to your health care provider about which screenings and vaccines you need and how often you need them. This information is not intended to replace advice given to you by your health care provider. Make sure you discuss any questions you have with your health care provider. Document Released: 11/18/2015 Document Revised: 07/11/2016 Document Reviewed: 08/23/2015 Elsevier Interactive Patient Education  2017 [place] Prevention in the Home Falls can cause injuries. They can happen to people of all ages. There are many things you can do to make your home safe and to help prevent falls. What can I do on the outside of my home?  Regularly fix the edges of walkways and driveways and fix any cracks.  Remove anything that might make you trip as you walk through a door, such as a raised step or threshold.  Trim any bushes or trees on the path to your home.  Use bright outdoor lighting.  Clear any walking paths of anything that might make someone trip, such as rocks or tools.  Regularly check to see if handrails are loose or broken. Make sure that both sides of any steps have handrails.  Any raised decks and porches should have guardrails on the edges.  Have any leaves, snow, or ice cleared regularly.  Use sand or salt on walking paths during  winter.  Clean up any spills in your garage right away. This includes oil or grease spills. What can I do in the bathroom?  Use night lights.  Install grab bars by the toilet and in the tub and shower. Do not use towel bars as grab bars.  Use non-skid mats or decals in the tub or shower.  If you need to sit down in the shower, use a plastic, non-slip stool.  Keep the floor dry. Clean up any water that spills on the floor as soon as it happens.  Remove soap buildup in the tub or shower regularly.  Attach bath mats securely with double-sided non-slip rug tape.  Do not have throw rugs and other things on the floor that can make you trip. What can I do in the bedroom?  Use night lights.  Make sure that you have a light by your bed that is easy to reach.  Do not use any sheets or blankets that are too big for your bed. They should not hang down onto the floor.  Have a firm chair that has side arms. You can use this for support while you get dressed.  Do not have throw rugs and  other things on the floor that can make you trip. What can I do in the kitchen?  Clean up any spills right away.  Avoid walking on wet floors.  Keep items that you use a lot in easy-to-reach places.  If you need to reach something above you, use a strong step stool that has a grab bar.  Keep electrical cords out of the way.  Do not use floor polish or wax that makes floors slippery. If you must use wax, use non-skid floor wax.  Do not have throw rugs and other things on the floor that can make you trip. What can I do with my stairs?  Do not leave any items on the stairs.  Make sure that there are handrails on both sides of the stairs and use them. Fix handrails that are broken or loose. Make sure that handrails are as long as the stairways.  Check any carpeting to make sure that it is firmly attached to the stairs. Fix any carpet that is loose or worn.  Avoid having throw rugs at the top or  bottom of the stairs. If you do have throw rugs, attach them to the floor with carpet tape.  Make sure that you have a light switch at the top of the stairs and the bottom of the stairs. If you do not have them, ask someone to add them for you. What else can I do to help prevent falls?  Wear shoes that:  Do not have high heels.  Have rubber bottoms.  Are comfortable and fit you well.  Are closed at the toe. Do not wear sandals.  If you use a stepladder:  Make sure that it is fully opened. Do not climb a closed stepladder.  Make sure that both sides of the stepladder are locked into place.  Ask someone to hold it for you, if possible.  Clearly mark and make sure that you can see:  Any grab bars or handrails.  First and last steps.  Where the edge of each step is.  Use tools that help you move around (mobility aids) if they are needed. These include:  Canes.  Walkers.  Scooters.  Crutches.  Turn on the lights when you go into a dark area. Replace any light bulbs as soon as they burn out.  Set up your furniture so you have a clear path. Avoid moving your furniture around.  If any of your floors are uneven, fix them.  If there are any pets around you, be aware of where they are.  Review your medicines with your doctor. Some medicines can make you feel dizzy. This can increase your chance of falling. Ask your doctor what other things that you can do to help prevent falls. This information is not intended to replace advice given to you by your health care provider. Make sure you discuss any questions you have with your health care provider. Document Released: 08/18/2009 Document Revised: 03/29/2016 Document Reviewed: 11/26/2014 Elsevier Interactive Patient Education  2017 [employer].

## 2020-01-07 NOTE — Progress Notes (Signed)
Subjective:   Tina Clark is a 54 y.o. female who presents for an Initial Medicare Annual Wellness Visit.  This visit is being conducted via phone call  - after an attmept to do on video chat - due to the COVID-19 pandemic. This patient has given me verbal consent via phone to conduct this visit, patient states they are participating from their home address. Some vital signs may be absent or patient reported.   Patient identification: identified by name, DOB, and current address.     Review of Systems      Cardiac Risk Factors include: hypertension     Objective:    There were no vitals filed for this visit. There is no height or weight on file to calculate BMI.  Advanced Directives 01/07/2020 06/04/2019 05/20/2019 12/06/2017 03/16/2017 02/05/2017  Does Patient Have a Medical Advance Directive? No No No No No No  Would patient like information on creating a medical advance directive? - - - No - Patient declined - -    Current Medications (verified) Outpatient Encounter Medications as of 01/07/2020  Medication Sig  . benztropine (COGENTIN) 2 MG tablet Take 2 mg by mouth daily.   . haloperidol decanoate (HALDOL DECANOATE) 50 MG/ML injection Inject 175 mg into the muscle every 28 (twenty-eight) days.   . vitamin B-12 (CYANOCOBALAMIN) 1000 MCG tablet Take 1 tablet (1,000 mcg total) by mouth daily.  Derrill Memo ON 01/14/2020] Zoster Vaccine Adjuvanted [facility]) injection Inject 0.5 mLs into the muscle once for 1 dose. Shot # 2. (Patient not taking: Reported on 01/07/2020)  . [DISCONTINUED] Cholecalciferol (VITAMIN D-3) 1000 units CAPS Take 2,000 Units by mouth daily.  . [DISCONTINUED] nitrofurantoin, macrocrystal-monohydrate, (MACROBID) 100 MG capsule Take 1 capsule (100 mg total) by mouth 2 (two) times daily. (Patient not taking: Reported on 01/07/2020)  . [DISCONTINUED] UNABLE TO FIND Focus Factor   No facility-administered encounter medications on file as of 01/07/2020.    Allergies  (verified) Tramadol   History: Past Medical History:  Diagnosis Date  . Anemia   . Bipolar disorder ([place])   . History of colonic polyps   . Mental impairment   . Schizophrenia, unspecified ([place])   . Seasonal allergies   . UTI (urinary tract infection)    Past Surgical History:  Procedure Laterality Date  . COLONOSCOPY WITH PROPOFOL N/A 12/06/2017   Procedure: COLONOSCOPY WITH PROPOFOL;  Surgeon: Lucilla Lame, MD;  Location: [place];  Service: Endoscopy;  Laterality: N/A;  . POLYPECTOMY  12/06/2017   Procedure: POLYPECTOMY;  Surgeon: Lucilla Lame, MD;  Location: [facility];  Service: Endoscopy;;   Family History  Problem Relation Age of Onset  . Hypertension Mother   . Other Father        unknown medical history  . Lung cancer Maternal Grandmother   . Arthritis Sister    Social History   Socioeconomic History  . Marital status: Single    Spouse name: Not on file  . Number of children: 0  . Years of education: Not on file  . Highest education level: Not on file  Occupational History  . Not on file  Tobacco Use  . Smoking status: Never Smoker  . Smokeless tobacco: Never Used  Substance and Sexual Activity  . Alcohol use: No  . Drug use: No  . Sexual activity: Not on file  Other Topics Concern  . Not on file  Social History Narrative   Single.   No children.   On disability.  Social Determinants of Health   Financial Resource Strain:   . Difficulty of Paying Living Expenses: Not on file  Food Insecurity:   . Worried About Charity fundraiser in the Last Year: Not on file  . Ran Out of Food in the Last Year: Not on file  Transportation Needs:   . Lack of Transportation (Medical): Not on file  . Lack of Transportation (Non-Medical): Not on file  Physical Activity:   . Days of Exercise per Week: Not on file  . Minutes of Exercise per Session: Not on file  Stress:   . Feeling of Stress : Not on file  Social Connections:   . Frequency of  Communication with Friends and Family: Not on file  . Frequency of Social Gatherings with Friends and Family: Not on file  . Attends Religious Services: Not on file  . Active Member of Clubs or Organizations: Not on file  . Attends Archivist Meetings: Not on file  . Marital Status: Not on file    Tobacco Counseling Counseling given: Not Answered   Clinical Intake:  Pre-visit preparation completed: Yes  Pain : No/denies pain     Nutritional Risks: None Diabetes: No            Activities of Daily Living In your present state of health, do you have any difficulty performing the following activities: 01/07/2020 07/17/2019  Hearing? N Y  Comment no hearing aids -  Vision? N Y  Comment eyeglasses, goes to [place] -  Difficulty concentrating or making decisions? N Y  Walking or climbing stairs? N N  Dressing or bathing? N N  Doing errands, shopping? N N  Preparing Food and eating ? N -  Using the Toilet? N -  In the past six months, have you accidently leaked urine? N -  Do you have problems with loss of bowel control? N -  Managing your Medications? N -  Managing your Finances? N -  Housekeeping or managing your Housekeeping? N -  Some recent data might be hidden     Immunizations and Health Maintenance Immunization History  Administered Date(s) Administered  . Td 01/03/2018   Health Maintenance Due  Topic Date Due  . MAMMOGRAM  12/25/2016    Patient Care Team: Venita Lick, NP as PCP - General (Nurse Practitioner)  Indicate any recent Medical Services you may have received from other than [facility] providers in the past year (date may be approximate).     Assessment:   This is a routine wellness examination for Tina Clark.  Hearing/Vision screen  Hearing Screening   125Hz  250Hz  500Hz  1000Hz  2000Hz  3000Hz  4000Hz  6000Hz  8000Hz   Right ear:           Left ear:           Vision Screening Comments: Goes to to [place]   Dietary issues and exercise activities  discussed: Current Exercise Habits: The patient does not participate in regular exercise at present, Exercise limited by: None identified  Goals Addressed   None    Depression Screen PHQ 2/9 Scores 07/17/2019 07/08/2019  PHQ - 2 Score 0 0  PHQ- 9 Score 2 4    Fall Risk Fall Risk  01/07/2020 07/08/2019  Falls in the past year? 0 0  Number falls in past yr: 0 0  Injury with Fall? 0 0  Follow up - Falls evaluation completed   [place]:  Any stairs in or around the  home? Yes  If so, are there any without handrails? No   Home free of loose throw rugs in walkways, pet beds, electrical cords, etc? Yes  Adequate lighting in your home to reduce risk of falls? Yes   ASSISTIVE DEVICES UTILIZED TO PREVENT FALLS:  Life alert? No  Use of a cane, walker or w/c? No  Grab bars in the bathroom? No  Shower chair or bench in shower? No  Elevated toilet seat or a handicapped toilet? No    DME ORDERS:  DME order needed?  No   TIMED UP AND GO:  Unable to perform    Cognitive Function:        Screening Tests Health Maintenance  Topic Date Due  . MAMMOGRAM  12/25/2016  . INFLUENZA VACCINE  02/03/2020 (Originally 06/06/2019)  . PAP SMEAR-Modifier  05/13/2022  . COLONOSCOPY  12/06/2022  . TETANUS/TDAP  01/04/2028  . HIV Screening  Completed    Qualifies for Shingles Vaccine? Yes  discussed shingrix vaccine - [facility] get [facility]   Tdap: up to date   Flu Vaccine: up to date   Pneumococcal Vaccine: not indicated     Cancer Screenings:  Colorectal Screening: Completed 2019  Repeat every 5 years  Mammogram: Completed at [place] a year ago. [facility]   Bone Density: not indicated   Lung Cancer Screening: (Low Dose CT Chest recommended if Age 53-80 years, 30 pack-year currently smoking OR have quit w/in 15years.) does not qualify.    Additional Screening:  Hepatitis C Screening: does not qualify  Vision Screening: Recommended  annual ophthalmology exams for early detection of glaucoma and other disorders of the eye. Is the patient up to date with their annual eye exam?  Yes  Who is the provider or what is the [facility] of the office in which the [facility]   Dental Screening: Recommended annual dental exams for proper oral hygiene  Community Resource Referral:  CRR required this visit?  No       Plan:  I have personally reviewed and addressed the Medicare Annual Wellness questionnaire and have noted the following in the patient's chart:  A. Medical and social history B. Use of alcohol, tobacco or illicit drugs  C. Current medications and supplements D. Functional ability and status E.  Nutritional status F.  Physical activity G. Advance directives H. List of other physicians I.  Hospitalizations, surgeries, and ER visits in previous 12 months J.  [place] such as hearing and vision if needed, cognitive and depression L. Referrals and appointments   In addition, I have reviewed and discussed with patient certain preventive protocols, quality metrics, and best practice recommendations. A written personalized care plan for preventive services as well as general preventive health recommendations were provided to patient.   Signed,    Bevelyn Ngo, LPN   [id number]  Nurse Health Advisor   Nurse Notes: none

## 2020-01-13 ENCOUNTER — Telehealth: Payer: Self-pay

## 2020-01-13 NOTE — Telephone Encounter (Signed)
Copied from [place] [id number]. Topic: General - Inquiry >> Jan 12, 2020  3:31 PM Percell Belt A wrote: Reason for CRM: pt called in and stated that she rec a injection everyone month and this past injection she rec'd it left a know at the injection site in her right arm.  She rec'd it 2 weeks ago.  She stated it dose not hurt.  Please advise  Best number [phone]

## 2020-01-13 NOTE — Telephone Encounter (Signed)
If not painful I would continue to monitor area and if increased in size come to office.  For now it is most likely swelling and irritation from injection.  Recommend applying ice to area every few hours and resting arm.

## 2020-01-14 NOTE — Telephone Encounter (Signed)
Pt aware of Jolene's message.

## 2020-01-15 ENCOUNTER — Ambulatory Visit: Payer: Medicare Other | Admitting: Nurse Practitioner

## 2020-04-06 ENCOUNTER — Ambulatory Visit: Payer: Self-pay | Admitting: *Deleted

## 2020-04-06 NOTE — Telephone Encounter (Signed)
Pt called with complaints "pins and needles in her hands, feet, and head" for the past 3 weeks; the pt says that she has these symptoms after she eats something sweet; she is having increased thirst; the pt is concerned because a provider told her that she is "borderline diabetic"; she would also like to discuss if labs need to be drawn; recommendations made per nurse triage protocol; decision tree completed; she would like to wait for her PCP's next available appt;  pt offered and accepted appt with Marnee Guarneri, [facility], 04/11/20 at 1600; she verbalized understanding; will route to office for notification.   Reason for Disposition . [1] Numbness or tingling on both sides of body AND [2] is a new symptom present > 24 hours  Answer Assessment - Initial Assessment Questions 1. SYMPTOM: "What is the main symptom you are concerned about?" (e.g., weakness, numbness)     Pins and needles on hands, feet, and head 2. ONSET: "When did this start?" (minutes, hours, days; while sleeping)     03/23/20 3. LAST NORMAL: "When was the last time you were normal (no symptoms)?"      4. PATTERN "Does this come and go, or has it been constant since it started?"  "Is it present now?"     Intermittent, after eating something sweet 5. CARDIAC SYMPTOMS: "Have you had any of the following symptoms: chest pain, difficulty breathing, palpitations?"   no   6. NEUROLOGIC SYMPTOMS: "Have you had any of the following symptoms: headache, dizziness, vision loss, double vision, changes in speech, unsteady on your feet?"     no 7. OTHER SYMPTOMS: "Do you have any other symptoms?"   Increased thirst 8. PREGNANCY: "Is there any chance you are pregnant?" "When was your last menstrual period?"     No menopause  Protocols used: NEUROLOGIC DEFICIT-A-AH

## 2020-04-06 NOTE — Telephone Encounter (Signed)
Noted  

## 2020-04-11 ENCOUNTER — Encounter: Payer: Self-pay | Admitting: Nurse Practitioner

## 2020-04-11 ENCOUNTER — Ambulatory Visit (INDEPENDENT_AMBULATORY_CARE_PROVIDER_SITE_OTHER): Payer: Medicare Other | Admitting: Nurse Practitioner

## 2020-04-11 ENCOUNTER — Other Ambulatory Visit: Payer: Self-pay

## 2020-04-11 VITALS — BP 112/74 | HR 53 | Temp 97.6°F | Ht 70.25 in | Wt 201.6 lb

## 2020-04-11 DIAGNOSIS — E538 Deficiency of other specified B group vitamins: Secondary | ICD-10-CM | POA: Diagnosis not present

## 2020-04-11 DIAGNOSIS — R200 Anesthesia of skin: Secondary | ICD-10-CM | POA: Diagnosis not present

## 2020-04-11 DIAGNOSIS — R202 Paresthesia of skin: Secondary | ICD-10-CM | POA: Diagnosis not present

## 2020-04-11 DIAGNOSIS — Z6828 Body mass index (BMI) 28.0-28.9, adult: Secondary | ICD-10-CM | POA: Diagnosis not present

## 2020-04-11 NOTE — Assessment & Plan Note (Signed)
Ongoing and new report of tingling occasionally, will recheck B12 level today and recommend she take her supplement daily.

## 2020-04-11 NOTE — Assessment & Plan Note (Signed)
Recommended eating smaller high protein, low fat meals more frequently and exercising 30 mins a day 5 times a week with a goal of 10-15lb weight loss in the next 3 months. Patient voiced their understanding and motivation to adhere to these recommendations.  

## 2020-04-11 NOTE — Progress Notes (Signed)
BP 112/74 (BP Location: Left Arm, Patient Position: Sitting, Cuff Size: Normal)   Pulse (!) 53   Temp 97.6 F (36.4 C) (Oral)   Ht 5' 10.25" (1.784 m)   Wt 201 lb 9.6 oz (91.4 kg)   LMP 01/04/2015 Comment: medicine she is on makes her not have periods. denies preg  SpO2 99%   BMI 28.72 kg/m    Subjective:    Patient ID: Tina Clark, female    DOB: 08-Jan-1966, 54 y.o.   MRN: [id number]  HPI: Tina Clark is a 54 y.o. female  Chief Complaint  Patient presents with  . Numbness    Patient states she's been feeling tingling in her hands and feet. Stated she was told it could be from diabetes.    TINGLING IN HANDS AND FEET Started having "tingling" in hands and feet about 2-3 weeks ago.  Reports provider at [facility] told her she was borderline diabetes, about one month ago.  Has felt fine to past couple of days with no neuropathy pain.  Notices tingling in hands and feet + occasionally head only when eats something sweet, but none noticed past couple of days.  When present is only for short period, for a few minutes and then it goes away.  She presents today to have labs checked.  Denies polyuria, polydipsia, or polyphagia.  No CP or SOB. Location: hands, feet, and occasionally head (bilateral) Pain: no Severity: mild  Quality:  tingling and pins and needles Frequency: intermittent Bilateral: yes Symmetric: yes Numbness: no Decreased sensation: no Weakness: no Context: stable Alleviating factors: goes away on own Aggravating factors: sweet drinks and food Treatments attempted: none  Relevant past medical, surgical, family and social history reviewed and updated as indicated. Interim medical history since our last visit reviewed. Allergies and medications reviewed and updated.  Review of Systems  Constitutional: Negative for activity change, appetite change, diaphoresis, fatigue and fever.  Respiratory: Negative for cough, chest tightness and shortness of breath.    Cardiovascular: Negative for chest pain, palpitations and leg swelling.  Gastrointestinal: Negative.   Endocrine: Negative for cold intolerance, heat intolerance, polydipsia, polyphagia and polyuria.  Neurological: Negative for dizziness, syncope, weakness, light-headedness and headaches.  Psychiatric/Behavioral: Negative.     Per HPI unless specifically indicated above     Objective:    BP 112/74 (BP Location: Left Arm, Patient Position: Sitting, Cuff Size: Normal)   Pulse (!) 53   Temp 97.6 F (36.4 C) (Oral)   Ht 5' 10.25" (1.784 m)   Wt 201 lb 9.6 oz (91.4 kg)   LMP 01/04/2015 Comment: medicine she is on makes her not have periods. denies preg  SpO2 99%   BMI 28.72 kg/m   Wt Readings from Last 3 Encounters:  04/11/20 201 lb 9.6 oz (91.4 kg)  10/15/19 210 lb (95.3 kg)  06/14/19 190 lb (86.2 kg)    Physical Exam Vitals and nursing note reviewed.  Constitutional:      General: She is awake. She is not in acute distress.    Appearance: She is well-developed, well-groomed and overweight. She is not ill-appearing.  HENT:     Head: Normocephalic.     Right Ear: Hearing normal.     Left Ear: Hearing normal.  Eyes:     General: Lids are normal.        Right eye: No discharge.        Left eye: No discharge.     Conjunctiva/sclera: Conjunctivae normal.  Pupils: Pupils are equal, round, and reactive to light.  Neck:     Thyroid: No thyromegaly.     Vascular: No carotid bruit.  Cardiovascular:     Rate and Rhythm: Normal rate and regular rhythm.     Heart sounds: Normal heart sounds. No murmur. No gallop.   Pulmonary:     Effort: Pulmonary effort is normal. No accessory muscle usage or respiratory distress.     Breath sounds: Normal breath sounds.  Abdominal:     General: Bowel sounds are normal.     Palpations: Abdomen is soft.  Musculoskeletal:     Cervical back: Normal range of motion and neck supple.     Right lower leg: No edema.     Left lower leg: No edema.      Comments: Normal strength and sensation BUE and BLE.  Feet:     Right foot:     Protective Sensation: 10 sites tested. 10 sites sensed.     Skin integrity: Skin integrity normal.     Left foot:     Protective Sensation: 10 sites tested. 10 sites sensed.     Skin integrity: Skin integrity normal.  Skin:    General: Skin is warm and dry.     Findings: No rash.  Neurological:     Mental Status: She is alert and oriented to person, place, and time.     Cranial Nerves: Cranial nerves are intact.     Sensory: Sensation is intact.     Motor: Motor function is intact.     Coordination: Coordination is intact.     Gait: Gait is intact.     Deep Tendon Reflexes: Reflexes are normal and symmetric.     Reflex Scores:      Brachioradialis reflexes are 2+ on the right side and 2+ on the left side.      Patellar reflexes are 2+ on the right side and 2+ on the left side. Psychiatric:        Attention and Perception: Attention normal.        Mood and Affect: Mood normal.        Speech: Speech normal.        Behavior: Behavior normal. Behavior is cooperative.        Thought Content: Thought content normal.    Results for orders placed or performed during the hospital encounter of 10/15/19  Urine culture   Specimen: Urine, Clean Catch  Result Value Ref Range   Specimen Description      URINE, CLEAN CATCH Performed at [facility], [address] [place], [place] [address]    Special Requests      NONE Performed at [facility], [address], [place], [place] [address]    Culture 10,000 COLONIES/mL STAPHYLOCOCCUS LUGDUNENSIS (A)    Report Status 10/18/2019 FINAL    Organism ID, Bacteria STAPHYLOCOCCUS LUGDUNENSIS (A)       Susceptibility   Staphylococcus lugdunensis - MIC*    CIPROFLOXACIN <=0.5 SENSITIVE Sensitive     GENTAMICIN <=0.5 SENSITIVE Sensitive     NITROFURANTOIN <=16 SENSITIVE Sensitive     OXACILLIN 2 SENSITIVE Sensitive     TETRACYCLINE  <=1 SENSITIVE Sensitive     VANCOMYCIN <=0.5 SENSITIVE Sensitive     TRIMETH/SULFA <=10 SENSITIVE Sensitive     CLINDAMYCIN <=0.25 SENSITIVE Sensitive     RIFAMPIN <=0.5 SENSITIVE Sensitive     Inducible Clindamycin NEGATIVE Sensitive     * 10,000 COLONIES/mL  STAPHYLOCOCCUS LUGDUNENSIS  Urinalysis, Complete w Microscopic  Result Value Ref Range   Color, Urine YELLOW YELLOW   APPearance HAZY (A) CLEAR   Specific Gravity, Urine 1.020 1.005 - 1.030   pH 8.5 (H) 5.0 - 8.0   Glucose, UA NEGATIVE NEGATIVE mg/dL   Hgb urine dipstick TRACE (A) NEGATIVE   Bilirubin Urine NEGATIVE NEGATIVE   Ketones, ur NEGATIVE NEGATIVE mg/dL   Protein, ur NEGATIVE NEGATIVE mg/dL   Nitrite NEGATIVE NEGATIVE   Leukocytes,Ua LARGE (A) NEGATIVE   Squamous Epithelial / LPF 6-10 0 - 5   WBC, UA 21-50 0 - 5 WBC/hpf   RBC / HPF 0-5 0 - 5 RBC/hpf   Bacteria, UA MANY (A) NONE SEEN      Assessment & Plan:   Problem List Items Addressed This Visit      Other   B12 deficiency    Ongoing and new report of tingling occasionally, will recheck B12 level today and recommend she take her supplement daily.      BMI 28.0-28.9,adult    Recommended eating smaller high protein, low fat meals more frequently and exercising 30 mins a day 5 times a week with a goal of 10-15lb weight loss in the next 3 months. Patient voiced their understanding and motivation to adhere to these recommendations.       Numbness and tingling in both hands - Primary    Ongoing for a few weeks to hands, feet, and occasionally head after eating or drinking something sweet.  Unsure if psychophysiologic due to [facility] of predi[facility], she is unsure of A1C number.  Will recheck A1C today and also check B12, TSH, and BMP.  She reports improvement over past couple days, recommend use of Tylenol at home as needed + cutting back on sweets as this seems to bring symptoms on.  WNL exam today.  Return in 8 weeks for follow-up or sooner if worsening.       Relevant Orders   HgB A1c   Vitamin B12   TSH   Basic metabolic panel       Follow up plan: Return in about 8 weeks (around 06/06/2020) for Neuropathy.

## 2020-04-11 NOTE — Assessment & Plan Note (Signed)
Ongoing for a few weeks to hands, feet, and occasionally head after eating or drinking something sweet.  Unsure if psychophysiologic due to recent [facility] report of prediabetes, she is unsure of A1C number.  Will recheck A1C today and also check B12, TSH, and BMP.  She reports improvement over past couple days, recommend use of Tylenol at home as needed + cutting back on sweets as this seems to bring symptoms on.  WNL exam today.  Return in 8 weeks for follow-up or sooner if worsening.

## 2020-04-11 NOTE — Patient Instructions (Signed)
Neuropathic Pain Neuropathic pain is pain caused by damage to the nerves that are responsible for certain sensations in your body (sensory nerves). The pain can be caused by:  Damage to the sensory nerves that send signals to your spinal cord and brain (peripheral nervous system).  Damage to the sensory nerves in your brain or spinal cord (central nervous system). Neuropathic pain can make you more sensitive to pain. Even a minor sensation can feel very painful. This is usually a long-term condition that can be difficult to treat. The type of pain differs from person to person. It may:  Start suddenly (acute), or it may develop slowly and last for a long time (chronic).  Come and go as damaged nerves heal, or it may stay at the same level for years.  Cause emotional distress, loss of sleep, and a lower quality of life. What are the causes? The most common cause of this condition is diabetes. Many other diseases and conditions can also cause neuropathic pain. Causes of neuropathic pain can be classified as:  Toxic. This is caused by medicines and chemicals. The most common cause of toxic neuropathic pain is damage from cancer treatments (chemotherapy).  Metabolic. This can be caused by: ? Diabetes. This is the most common disease that damages the nerves. ? Lack of vitamin B from long-term alcohol abuse.  Traumatic. Any injury that cuts, crushes, or stretches a nerve can cause damage and pain. A common example is feeling pain after losing an arm or leg (phantom limb pain).  Compression-related. If a sensory nerve gets trapped or compressed for a long period of time, the blood supply to the nerve can be cut off.  Vascular. Many blood vessel diseases can cause neuropathic pain by decreasing blood supply and oxygen to nerves.  Autoimmune. This type of pain results from diseases in which the body's defense system (immune system) mistakenly attacks sensory nerves. Examples of autoimmune diseases  that can cause neuropathic pain include lupus and multiple sclerosis.  Infectious. Many types of viral infections can damage sensory nerves and cause pain. Shingles infection is a common cause of this type of pain.  Inherited. Neuropathic pain can be a symptom of many diseases that are passed down through families (genetic). What increases the risk? You are more likely to develop this condition if:  You have diabetes.  You smoke.  You drink too much alcohol.  You are taking certain medicines, including medicines that kill cancer cells (chemotherapy) or that treat immune system disorders. What are the signs or symptoms? The main symptom is pain. Neuropathic pain is often described as:  Burning.  Shock-like.  Stinging.  Hot or cold.  Itching. How is this diagnosed? No single test can diagnose neuropathic pain. It is diagnosed based on:  Physical exam and your symptoms. Your health care provider will ask you about your pain. You may be asked to use a pain scale to describe how bad your pain is.  Tests. These may be done to see if you have a high sensitivity to pain and to help find the cause and location of any sensory nerve damage. They include: ? Nerve conduction studies to test how well nerve signals travel through your sensory nerves (electrodiagnostic testing). ? Stimulating your sensory nerves through electrodes on your skin and measuring the response in your spinal cord and brain (somatosensory evoked potential).  Imaging studies, such as: ? X-rays. ? CT scan. ? MRI. How is this treated? Treatment for neuropathic pain may change   over time. You may need to try different treatment options or a combination of treatments. Some options include:  Treating the underlying cause of the neuropathy, such as diabetes, kidney disease, or vitamin deficiencies.  Stopping medicines that can cause neuropathy, such as chemotherapy.  Medicine to relieve pain. Medicines may  include: ? Prescription or over-the-counter pain medicine. ? Anti-seizure medicine. ? Antidepressant medicines. ? Pain-relieving patches that are applied to painful areas of skin. ? A medicine to numb the area (local anesthetic), which can be injected as a nerve block.  Transcutaneous nerve stimulation. This uses electrical currents to block painful nerve signals. The treatment is painless.  Alternative treatments, such as: ? Acupuncture. ? Meditation. ? Massage. ? Physical therapy. ? Pain management programs. ? Counseling. Follow these instructions at home: Medicines   Take over-the-counter and prescription medicines only as told by your health care provider.  Do not drive or use heavy machinery while taking prescription pain medicine.  If you are taking prescription pain medicine, take actions to prevent or treat constipation. Your health care provider may recommend that you: ? Drink enough fluid to keep your urine pale yellow. ? Eat foods that are high in fiber, such as fresh fruits and vegetables, whole grains, and beans. ? Limit foods that are high in fat and processed sugars, such as fried or sweet foods. ? Take an over-the-counter or prescription medicine for constipation. Lifestyle   Have a good support system at home.  Consider joining a chronic pain support group.  Do not use any products that contain nicotine or tobacco, such as cigarettes and e-cigarettes. If you need help quitting, ask your health care provider.  Do not drink alcohol. General instructions  Learn as much as you can about your condition.  Work closely with all your health care providers to find the treatment plan that works best for you.  Ask your health care provider what activities are safe for you.  Keep all follow-up visits as told by your health care provider. This is important. Contact a health care provider if:  Your pain treatments are not working.  You are having side effects  from your medicines.  You are struggling with tiredness (fatigue), mood changes, depression, or anxiety. Summary  Neuropathic pain is pain caused by damage to the nerves that are responsible for certain sensations in your body (sensory nerves).  Neuropathic pain may come and go as damaged nerves heal, or it may stay at the same level for years.  Neuropathic pain is usually a long-term condition that can be difficult to treat. Consider joining a chronic pain support group. This information is not intended to replace advice given to you by your health care provider. Make sure you discuss any questions you have with your health care provider. Document Revised: 02/12/2019 Document Reviewed: 11/08/2017 Elsevier Patient Education  2020 [employer].  

## 2020-04-12 LAB — BASIC METABOLIC PANEL
BUN/Creatinine Ratio: 7 — ABNORMAL LOW (ref 9–23)
BUN: 6 mg/dL (ref 6–24)
CO2: 25 mmol/L (ref 20–29)
Calcium: 9.4 mg/dL (ref 8.7–10.2)
Chloride: 105 mmol/L (ref 96–106)
Creatinine, Ser: 0.89 mg/dL (ref 0.57–1.00)
GFR calc Af Amer: 86 mL/min/{1.73_m2} (ref >59)
GFR calc non Af Amer: 74 mL/min/{1.73_m2} (ref >59)
Glucose: 88 mg/dL (ref 65–99)
Potassium: 3.7 mmol/L (ref 3.5–5.2)
Sodium: 141 mmol/L (ref 134–144)

## 2020-04-12 LAB — HEMOGLOBIN A1C
Est. average glucose Bld gHb Est-mCnc: 108 mg/dL
Hgb A1c MFr Bld: 5.4 % (ref 4.8–5.6)

## 2020-04-12 LAB — TSH: TSH: 1.05 u[IU]/mL (ref 0.450–4.500)

## 2020-04-12 LAB — VITAMIN B12: Vitamin B-12: 1232 pg/mL (ref 232–1245)

## 2020-04-12 NOTE — Progress Notes (Signed)
Contacted via [place] afternoon Jassmin.  Your labs have returned and overall A1C shows no prediabetes or diabetes, as level 5.4%.  B12 level is normal, as is thyroid level.  Electrolytes and kidney function are normal.  Overall very reassuring labs!!  Any questions? Keep being awesome!! Kindest regards, Crystal Scarberry

## 2020-04-18 ENCOUNTER — Telehealth: Payer: Self-pay | Admitting: Nurse Practitioner

## 2020-04-18 NOTE — Telephone Encounter (Signed)
Has patient discussed the tingling with you, I explained to her that her blood sugar was normal. Does she need an office visit?

## 2020-04-18 NOTE — Telephone Encounter (Signed)
Patient notified. Appt scheduled.

## 2020-04-18 NOTE — Telephone Encounter (Signed)
If ongoing I do recommend return office visit and we may need to send to neurology.  She had mentioned only noticing these episodes when eating sweets at last visit, I would recommend she keep food diary and monitor when symptoms present and with what foods.

## 2020-04-18 NOTE — Telephone Encounter (Signed)
Copied from [place] [id number]. Topic: Quick Communication - Lab Results (Clinic Use ONLY) >> Apr 18, 2020  8:25 AM [name] wrote: Pt would like a call back about her lab results.  Advised pt Tina Clark sent mychart message about her lab results. Pt states she cannot get into mychart .  Offered to help her get into her mychart, but she states she is not interested in going through it at this moment.  Pt prefers a call.

## 2020-04-18 NOTE — Telephone Encounter (Signed)
Patient aware of most recent lab results but would like to know if her blood glucose level was WNL why is she experiencing tingling sensations thru her hands and feet. Patient seeking clinical advice

## 2020-04-18 NOTE — Telephone Encounter (Signed)
Spoke with patient about labs 

## 2020-04-27 ENCOUNTER — Ambulatory Visit: Payer: Self-pay | Admitting: Nurse Practitioner

## 2020-06-10 ENCOUNTER — Ambulatory Visit: Payer: Medicare Other | Admitting: Nurse Practitioner

## 2020-07-08 ENCOUNTER — Other Ambulatory Visit: Payer: Self-pay

## 2020-07-08 ENCOUNTER — Encounter: Payer: Self-pay | Admitting: Emergency Medicine

## 2020-07-08 ENCOUNTER — Ambulatory Visit
Admission: EM | Admit: 2020-07-08 | Discharge: 2020-07-08 | Disposition: A | Discharge status: Home / Self Care | Payer: Medicare Other | Attending: Emergency Medicine | Admitting: Emergency Medicine

## 2020-07-08 DIAGNOSIS — R300 Dysuria: Secondary | ICD-10-CM

## 2020-07-08 DIAGNOSIS — R350 Frequency of micturition: Secondary | ICD-10-CM | POA: Diagnosis present

## 2020-07-08 LAB — URINALYSIS, COMPLETE (UACMP) WITH MICROSCOPIC
Bilirubin Urine: NEGATIVE
Glucose, UA: NEGATIVE mg/dL
Nitrite: NEGATIVE
Protein, ur: NEGATIVE mg/dL
Specific Gravity, Urine: 1.015 (ref 1.005–1.030)
pH: 5.5 (ref 5.0–8.0)

## 2020-07-08 MED ORDER — NITROFURANTOIN MONOHYD MACRO 100 MG PO CAPS: 100.0000 mg | ORAL_CAPSULE | Freq: Two times a day (BID) | ORAL | 0 refills | Status: AC

## 2020-07-08 NOTE — ED Triage Notes (Signed)
Patient c/o burning when urinating and urinary frequency that started 2 days ago.

## 2020-07-08 NOTE — ED Provider Notes (Signed)
[facility]    CSN: [id number] Arrival date & time: 07/08/20  1624      History   Chief Complaint Chief Complaint  Patient presents with   Dysuria   Urinary Frequency    HPI Tina Clark is a 54 y.o. female.   Tina Clark presents with complaints of dysuria and frequency which started 1-2 weeks ago and has improved. No fevers. Back pain yesterday. None today. No pelvic or abdominal pain. No gi symptoms. Has had UTI's in the past which felt similar. Denies  Any vaginal symptoms.    ROS per HPI, negative if not otherwise mentioned.      Past Medical History:  Diagnosis Date   Anemia    Bipolar disorder ([place])    History of colonic polyps    Mental impairment    Schizophrenia, unspecified ([place])    Seasonal allergies    UTI (urinary tract infection)     Patient Active Problem List   Diagnosis Date Noted   BMI 28.0-28.9,adult 04/11/2020   Numbness and tingling in both hands 04/11/2020   B12 deficiency 07/08/2019   Pruritus 05/07/2019   Bipolar 1 disorder ([place]) 02/04/2019   History of schizophrenia 02/04/2019   Special screening for malignant neoplasms, colon    Benign neoplasm of ascending colon    Polyp of sigmoid colon    Acute pain of left knee 08/14/2017   Effusion of left knee 08/14/2017    Past Surgical History:  Procedure Laterality Date   COLONOSCOPY WITH PROPOFOL N/A 12/06/2017   Procedure: COLONOSCOPY WITH PROPOFOL;  Surgeon: Lucilla Lame, MD;  Location: [facility];  Service: Endoscopy;  Laterality: N/A;   POLYPECTOMY  12/06/2017   Procedure: POLYPECTOMY;  Surgeon: Lucilla Lame, MD;  Location: [facility];  Service: Endoscopy;;    OB History   No obstetric history on file.      Home Medications    Prior to Admission medications   Medication Sig Start Date End Date Taking? Authorizing Provider  benztropine (COGENTIN) 2 MG tablet Take 2 mg by mouth daily.    Yes [provider]   haloperidol decanoate (HALDOL DECANOATE) 50 MG/ML injection Inject 175 mg into the muscle every 28 (twenty-eight) days.    Yes [provider]  Multiple Vitamin (MULTIVITAMIN PO) Take 1 Dose by mouth daily.   Yes [provider]  vitamin B-12 (CYANOCOBALAMIN) 1000 MCG tablet Take 1 tablet (1,000 mcg total) by mouth daily. 06/04/19  Yes Earlie Server, MD  nitrofurantoin, macrocrystal-monohydrate, (MACROBID) 100 MG capsule Take 1 capsule (100 mg total) by mouth 2 (two) times daily for 5 days. 07/08/20 07/13/20  Zigmund Gottron, NP    Family History Family History  Problem Relation Age of Onset   Hypertension Mother    Other Father        unknown medical history   Lung cancer Maternal Grandmother    Arthritis Sister     Social History Social History   Tobacco Use   Smoking status: Never Smoker   Smokeless tobacco: Never Used  Scientific laboratory technician Use: Never used  Substance Use Topics   Alcohol use: No   Drug use: No     Allergies   Tramadol   Review of Systems Review of Systems   Physical Exam Triage Vital Signs ED Triage Vitals  Enc Vitals Group     BP 07/08/20 1644 121/78     Pulse Rate 07/08/20 1644 75  Resp 07/08/20 1644 16     Temp 07/08/20 1644 97.7 F (36.5 C)     Temp Source 07/08/20 1644 Oral     SpO2 07/08/20 1644 100 %     Weight 07/08/20 1642 201 lb 8 oz (91.4 kg)     Height --      Head Circumference --      Peak Flow --      Pain Score 07/08/20 1642 0     Pain Loc --      Pain Edu? --      Excl. in [place]? --    No data found.  Updated Vital Signs BP 121/78 (BP Location: Right Arm)    Pulse 75    Temp 97.7 F (36.5 C) (Oral)    Resp 16    Wt 201 lb 8 oz (91.4 kg)    LMP 01/04/2015 Comment: medicine she is on makes her not have periods. denies preg   SpO2 100%    BMI 28.71 kg/m   Visual Acuity Right Eye Distance:   Left Eye Distance:   Bilateral Distance:    Right Eye Near:   Left Eye Near:    Bilateral Near:      Physical Exam Constitutional:      General: She is not in acute distress.    Appearance: She is well-developed.  Cardiovascular:     Rate and Rhythm: Normal rate.  Pulmonary:     Effort: Pulmonary effort is normal.  Abdominal:     Tenderness: There is no abdominal tenderness. There is no right CVA tenderness or left CVA tenderness.  Skin:    General: Skin is warm and dry.  Neurological:     Mental Status: She is alert and oriented to person, place, and time.      UC Treatments / Results  Labs (all labs ordered are listed, but only abnormal results are displayed) Labs Reviewed  URINALYSIS, COMPLETE (UACMP) WITH MICROSCOPIC - Abnormal; Notable for the following components:      Result Value   Hgb urine dipstick SMALL (*)    Ketones, ur TRACE (*)    Leukocytes,Ua SMALL (*)    Bacteria, UA FEW (*)    All other components within normal limits  URINE CULTURE    EKG   Radiology No results found.  Procedures Procedures (including critical care time)  Medications Ordered in UC Medications - No data to display  Initial Impression / Assessment and Plan / UC Course  I have reviewed the triage vital signs and the nursing notes.  Pertinent labs & imaging results that were available during my care of the patient were reviewed by me and considered in my medical decision making (see chart for details).     Urine with small hgb and leuks with few bacteria. Opted to initiate macrobid with culture pending. Return precautions provided. Patient verbalized understanding and agreeable to plan.   Final Clinical Impressions(s) / UC Diagnoses   Final diagnoses:  Dysuria  Urinary frequency     Discharge Instructions     It does appear you have a mild UTI.  Complete course of antibiotics.  Drink plenty of water to empty bladder regularly. Avoid alcohol and caffeine as these may irritate the bladder.   If symptoms worsen or do not improve in the next week to return to be seen  or to follow up with your PCP.      ED Prescriptions    Medication Sig Dispense Auth. Provider  nitrofurantoin, macrocrystal-monohydrate, (MACROBID) 100 MG capsule Take 1 capsule (100 mg total) by mouth 2 (two) times daily for 5 days. 10 capsule Zigmund Gottron, NP     PDMP not reviewed this encounter.   Zigmund Gottron, NP 07/08/20 1844

## 2020-07-08 NOTE — Discharge Instructions (Signed)
It does appear you have a mild UTI.  Complete course of antibiotics.  Drink plenty of water to empty bladder regularly. Avoid alcohol and caffeine as these may irritate the bladder.   If symptoms worsen or do not improve in the next week to return to be seen or to follow up with your PCP.

## 2020-07-11 LAB — URINE CULTURE: Culture: >=100000 — AB

## 2020-08-11 ENCOUNTER — Ambulatory Visit: Payer: Self-pay | Admitting: Nurse Practitioner

## 2020-08-11 NOTE — Telephone Encounter (Signed)
Pt reports "No energy for about 2 years." States more fatigued than usual, "For 2 years." Reports at times cannot perform usual ADLs. Also reports "Milk, meat, juice make me itch."  Ongoing "For years." No rash. Pt declined appt. Asking for advise on what she can take to "Get my energy back." States usually sleeps well at night. Assured NT would send to practice for PCPs review. Care advise given per protocol.  CB# [phone]  Reason for Disposition  Fatigue is a chronic symptom (recurrent or ongoing AND present > 4 weeks)  Answer Assessment - Initial Assessment Questions 1. DESCRIPTION: "Describe how you are feeling."     No energy 2. SEVERITY: "How bad is it?"  "Can you stand and walk?"   - MILD - Feels weak or tired, but does not interfere with work, school or normal activities   - [place] to stand and walk; weakness interferes with work, school, or normal activities   - SEVERE - Unable to stand or walk     mild 3. ONSET:  "When did the weakness begin?"     2 years ago 4. CAUSE: "What do you think is causing the weakness?"    "I don't eat meat because it makes me itch." 5. MEDICINES: "Have you recently started a new medicine or had a change in the amount of a medicine?"     no 6. OTHER SYMPTOMS: "Do you have any other symptoms?" (e.g., chest pain, fever, cough, SOB, vomiting, diarrhea, bleeding, other areas of pain)     "Itching when drinking juices, milk, meat." Skin will turn white sometimes.  Protocols used: WEAKNESS (GENERALIZED) AND FATIGUE-A-AH

## 2020-08-12 NOTE — Telephone Encounter (Signed)
Lvm ,with information with message below.

## 2020-08-12 NOTE — Telephone Encounter (Signed)
Recommend she schedule appointment to further discuss/assess or schedule with her primary care at [facility].

## 2020-08-17 NOTE — Telephone Encounter (Signed)
Lvm ,with information with message below.

## 2020-08-18 NOTE — Telephone Encounter (Signed)
Un able to Lvm ,with information with message below vm full

## 2020-09-12 ENCOUNTER — Encounter: Payer: Self-pay | Admitting: Emergency Medicine

## 2020-09-12 ENCOUNTER — Ambulatory Visit
Admission: EM | Admit: 2020-09-12 | Discharge: 2020-09-12 | Disposition: A | Discharge status: Home / Self Care | Payer: No Typology Code available for payment source | Attending: Emergency Medicine | Admitting: Emergency Medicine

## 2020-09-12 ENCOUNTER — Other Ambulatory Visit: Payer: Self-pay

## 2020-09-12 DIAGNOSIS — B9689 Other specified bacterial agents as the cause of diseases classified elsewhere: Secondary | ICD-10-CM | POA: Diagnosis present

## 2020-09-12 DIAGNOSIS — R319 Hematuria, unspecified: Secondary | ICD-10-CM | POA: Insufficient documentation

## 2020-09-12 DIAGNOSIS — N760 Acute vaginitis: Secondary | ICD-10-CM | POA: Diagnosis not present

## 2020-09-12 DIAGNOSIS — N390 Urinary tract infection, site not specified: Secondary | ICD-10-CM | POA: Insufficient documentation

## 2020-09-12 LAB — URINALYSIS, COMPLETE (UACMP) WITH MICROSCOPIC
Glucose, UA: NEGATIVE mg/dL
Nitrite: NEGATIVE
Specific Gravity, Urine: >1.03 — ABNORMAL HIGH (ref 1.005–1.030)
WBC, UA: >50 WBC/hpf (ref 0–5)
pH: 5 (ref 5.0–8.0)

## 2020-09-12 LAB — WET PREP, GENITAL
Sperm: NONE SEEN
Trich, Wet Prep: NONE SEEN
Yeast Wet Prep HPF POC: NONE SEEN

## 2020-09-12 MED ORDER — NITROFURANTOIN MONOHYD MACRO 100 MG PO CAPS: 100.0000 mg | ORAL_CAPSULE | Freq: Two times a day (BID) | ORAL | 0 refills | Status: DC

## 2020-09-12 MED ORDER — METRONIDAZOLE 500 MG PO TABS: 500.0000 mg | ORAL_TABLET | Freq: Two times a day (BID) | ORAL | 0 refills | Status: AC

## 2020-09-12 MED ORDER — PHENAZOPYRIDINE HCL 200 MG PO TABS: 200.0000 mg | ORAL_TABLET | Freq: Three times a day (TID) | ORAL | 0 refills | Status: AC | PRN

## 2020-09-12 NOTE — ED Triage Notes (Addendum)
Patient c/o urinary frequency that started 2 weeks ago. She states her symptoms have improved since taking AZO.

## 2020-09-12 NOTE — Discharge Instructions (Addendum)
Your wet prep was positive for bacterial vaginosis which is a common vaginal infection.  I suspect this could be contributing to your symptoms.  Finish the Flagyl, even if you feel better.  Pyridium will help with your urinary symptoms.  The Macrobid is for urinary tract infection.  We will contact you if we need to change your antibiotics.

## 2020-09-12 NOTE — ED Provider Notes (Signed)
HPI  SUBJECTIVE:  Tina Clark is a 54 y.o. female who presents with 2 weeks of "UTI".  She reports having hematuria once at the beginning of her symptoms, no further episodes, urinary urgency, frequency, "fishy smelling" urine.  She reports suprapubic seconds long sharp pain when she holds her urine that resolves with urination.  No dysuria, cloudy urine.  No vomiting, fevers, vaginal itching, odor, rash, discharge.  No back, pelvic pain.  She has not been sexually active in 20 years.  She does not douche.  No antibiotics in the past month.  No perfumed soaps, body washes.  No antipyretic in the past 6 hours.  She tried Azo with improvement in her symptoms.  No aggravating factors.  She has a past medical history of E. coli UTI that was pansensitive 2 months ago.  No history of pyelonephritis, nephrolithiasis, BV, yeast, diabetes, hypertension.  DXA:JOINOMV, Barbaraann Faster, NP   Past Medical History:  Diagnosis Date  . Anemia   . Bipolar disorder ([place])   . History of colonic polyps   . Mental impairment   . Schizophrenia, unspecified ([place])   . Seasonal allergies   . UTI (urinary tract infection)     Past Surgical History:  Procedure Laterality Date  . COLONOSCOPY WITH PROPOFOL N/A 12/06/2017   Procedure: COLONOSCOPY WITH PROPOFOL;  Surgeon: Lucilla Lame, MD;  Location: [place];  Service: Endoscopy;  Laterality: N/A;  . POLYPECTOMY  12/06/2017   Procedure: POLYPECTOMY;  Surgeon: Lucilla Lame, MD;  Location: [place];  Service: Endoscopy;;    Family History  Problem Relation Age of Onset  . Hypertension Mother   . Other Father        unknown medical history  . Lung cancer Maternal Grandmother   . Arthritis Sister     Social History   Tobacco Use  . Smoking status: Never Smoker  . Smokeless tobacco: Never Used  Vaping Use  . Vaping Use: Never used  Substance Use Topics  . Alcohol use: No  . Drug use: No    No current facility-administered medications for  this encounter.  Current Outpatient Medications:  .  haloperidol decanoate (HALDOL DECANOATE) 50 MG/ML injection, Inject 175 mg into the muscle every 28 (twenty-eight) days. , Disp: , Rfl:  .  benztropine (COGENTIN) 2 MG tablet, Take 2 mg by mouth daily. , Disp: , Rfl:  .  metroNIDAZOLE (FLAGYL) 500 MG tablet, Take 1 tablet (500 mg total) by mouth 2 (two) times daily for 7 days., Disp: 14 tablet, Rfl: 0 .  Multiple Vitamin (MULTIVITAMIN PO), Take 1 Dose by mouth daily., Disp: , Rfl:  .  nitrofurantoin, macrocrystal-monohydrate, (MACROBID) 100 MG capsule, Take 1 capsule (100 mg total) by mouth 2 (two) times daily. X 5 days, Disp: 10 capsule, Rfl: 0 .  phenazopyridine (PYRIDIUM) 200 MG tablet, Take 1 tablet (200 mg total) by mouth 3 (three) times daily as needed for pain., Disp: 6 tablet, Rfl: 0 .  vitamin B-12 (CYANOCOBALAMIN) 1000 MCG tablet, Take 1 tablet (1,000 mcg total) by mouth daily., Disp: 90 tablet, Rfl: 1  Allergies  Allergen Reactions  . Tramadol Nausea Only     ROS  As noted in HPI.   Physical Exam  BP 111/68 (BP Location: Right Arm)   Pulse 64   Temp 98.3 F (36.8 C) (Oral)   Resp 18   Ht 5\' 10"  (1.778 m)   Wt 86.3 kg   LMP 01/04/2015 Comment: medicine she is on makes  her not have periods. denies preg  SpO2 99%   BMI 27.31 kg/m   Constitutional: Well developed, well nourished, no acute distress Eyes:  EOMI, conjunctiva normal bilaterally HENT: Normocephalic, atraumatic,mucus membranes moist Respiratory: Normal inspiratory effort Cardiovascular: Normal rate GI: nondistended soft.  No suprapubic, flank tenderness Back: No CVAT skin: No rash, skin intact Musculoskeletal: no deformities Neurologic: Alert & oriented x 3, no focal neuro deficits Psychiatric: Speech and behavior appropriate   ED Course   Medications - No data to display  Orders Placed This Encounter  Procedures  . Urine culture    Standing Status:   Standing    Number of Occurrences:   1   . Wet prep, genital    Standing Status:   Standing    Number of Occurrences:   1  . Urinalysis, Complete w Microscopic    Standing Status:   Standing    Number of Occurrences:   1    Results for orders placed or performed during the hospital encounter of 09/12/20 (from the past 24 hour(s))  Urinalysis, Complete w Microscopic Urine, Clean Catch     Status: Abnormal   Collection Time: 09/12/20  4:52 PM  Result Value Ref Range   Color, Urine YELLOW YELLOW   APPearance HAZY (A) CLEAR   Specific Gravity, Urine >1.030 (H) 1.005 - 1.030   pH 5.0 5.0 - 8.0   Glucose, UA NEGATIVE NEGATIVE mg/dL   Hgb urine dipstick SMALL (A) NEGATIVE   Bilirubin Urine SMALL (A) NEGATIVE   Ketones, ur TRACE (A) NEGATIVE mg/dL   Protein, ur TRACE (A) NEGATIVE mg/dL   Nitrite NEGATIVE NEGATIVE   Leukocytes,Ua TRACE (A) NEGATIVE   Squamous Epithelial / LPF 6-10 0 - 5   WBC, UA >50 0 - 5 WBC/hpf   RBC / HPF 0-5 0 - 5 RBC/hpf   Bacteria, UA FEW (A) NONE SEEN   WBC Clumps PRESENT    Mucus PRESENT   Wet prep, genital     Status: Abnormal   Collection Time: 09/12/20  5:51 PM   Specimen: Vaginal  Result Value Ref Range   Yeast Wet Prep HPF POC NONE SEEN NONE SEEN   Trich, Wet Prep NONE SEEN NONE SEEN   Clue Cells Wet Prep HPF POC PRESENT (A) NONE SEEN   WBC, Wet Prep HPF POC MODERATE (A) NONE SEEN   Sperm NONE SEEN    No results found.  ED Clinical Impression  1. BV (bacterial vaginosis)   2. Urinary tract infection with hematuria, site unspecified      ED Assessment/Plan  Similar presentation several months ago.  Was started empirically on Macrobid.  Urine culture grew out E. coli UTI that was pansensitive.  UA concentrated with trace hemoglobin, ketones, proteinuria, leukocytes, pyuria.  She has a few bacteria, but this is a contaminated specimen.  Given her symptoms and previous similar presentation, will start on Macrobid awaiting culture results.  She also describes her urine is smelling  "fishy".  Will check wet prep as well.  Do not think that we need to check for STIs, states that she has not been sexually active in 20 years.  Wet prep positive for BV.  Could be contributing to her symptoms.  Will add Flagyl to the Macrobid.  Discussed labs, MDM, treatment plan, and plan for follow-up with patient.  patient agrees with plan.   Meds ordered this encounter  Medications  . metroNIDAZOLE (FLAGYL) 500 MG tablet    Sig: Take 1 tablet (  500 mg total) by mouth 2 (two) times daily for 7 days.    Dispense:  14 tablet    Refill:  0  . phenazopyridine (PYRIDIUM) 200 MG tablet    Sig: Take 1 tablet (200 mg total) by mouth 3 (three) times daily as needed for pain.    Dispense:  6 tablet    Refill:  0  . nitrofurantoin, macrocrystal-monohydrate, (MACROBID) 100 MG capsule    Sig: Take 1 capsule (100 mg total) by mouth 2 (two) times daily. X 5 days    Dispense:  10 capsule    Refill:  0    *This clinic note was created using [employer]. Therefore, there may be occasional mistakes despite careful proofreading.   ?   Melynda Ripple, MD 09/12/20 [phone]

## 2020-09-14 LAB — URINE CULTURE

## 2020-09-21 ENCOUNTER — Ambulatory Visit: Payer: Self-pay

## 2020-09-21 NOTE — Telephone Encounter (Signed)
Pt calling stating that she has BV and is requesting to know if she can take a bath instead of a shower as well as to answer other questions. Please advise.Pt. asking hygiene questions. Reviewed personal hygiene. Encouraged to shower if possible over tub bath. Verbalizes understanding.

## 2020-10-04 ENCOUNTER — Encounter: Payer: Self-pay | Admitting: Emergency Medicine

## 2020-10-04 ENCOUNTER — Other Ambulatory Visit: Payer: Self-pay

## 2020-10-04 ENCOUNTER — Ambulatory Visit
Admission: EM | Admit: 2020-10-04 | Discharge: 2020-10-04 | Disposition: A | Discharge status: Home / Self Care | Payer: No Typology Code available for payment source | Attending: Family Medicine | Admitting: Family Medicine

## 2020-10-04 DIAGNOSIS — B9689 Other specified bacterial agents as the cause of diseases classified elsewhere: Secondary | ICD-10-CM | POA: Insufficient documentation

## 2020-10-04 DIAGNOSIS — N760 Acute vaginitis: Secondary | ICD-10-CM | POA: Diagnosis present

## 2020-10-04 LAB — URINALYSIS, COMPLETE (UACMP) WITH MICROSCOPIC
Bacteria, UA: NONE SEEN
Bilirubin Urine: NEGATIVE
Glucose, UA: NEGATIVE mg/dL
Hgb urine dipstick: NEGATIVE
Ketones, ur: NEGATIVE mg/dL
Leukocytes,Ua: NEGATIVE
Nitrite: NEGATIVE
Protein, ur: NEGATIVE mg/dL
RBC / HPF: NONE SEEN RBC/hpf (ref 0–5)
Specific Gravity, Urine: 1.015 (ref 1.005–1.030)
pH: 6.5 (ref 5.0–8.0)

## 2020-10-04 LAB — WET PREP, GENITAL
Sperm: NONE SEEN
Trich, Wet Prep: NONE SEEN
Yeast Wet Prep HPF POC: NONE SEEN

## 2020-10-04 MED ORDER — METRONIDAZOLE 500 MG PO TABS: 500.0000 mg | ORAL_TABLET | Freq: Two times a day (BID) | ORAL | 0 refills | Status: DC

## 2020-10-04 NOTE — ED Triage Notes (Signed)
Patient c/o urinary frequency that started 2 days ago. She denies any other symptoms.

## 2020-10-04 NOTE — ED Provider Notes (Signed)
[facility]    CSN: [id number] Arrival date & time: 10/04/20  1750      History   Chief Complaint Chief Complaint  Patient presents with  . Urinary Frequency    HPI Tina Clark is a 54 y.o. female.   HPI   76-year-old female here for evaluation of urinary frequency, urgency, and burning.  Patient reports that she has had symptoms for the past 2 days.  She is also has some suprapubic pain.  Patient denies fever, nausea, vomiting, blood in urine, vaginal discharge or itching, or or back pain.  Patient states that she had a UTI about 2 weeks ago.  She also had bacterial vaginosis the last time she had a UTI.  Past Medical History:  Diagnosis Date  . Anemia   . Bipolar disorder ([place])   . History of colonic polyps   . Mental impairment   . Schizophrenia, unspecified ([place])   . Seasonal allergies   . UTI (urinary tract infection)     Patient Active Problem List   Diagnosis Date Noted  . BMI 28.0-28.9,adult 04/11/2020  . Numbness and tingling in both hands 04/11/2020  . B12 deficiency 07/08/2019  . Pruritus 05/07/2019  . Bipolar 1 disorder ([place]) 02/04/2019  . History of schizophrenia 02/04/2019  . Special screening for malignant neoplasms, colon   . Benign neoplasm of ascending colon   . Polyp of sigmoid colon   . Acute pain of left knee 08/14/2017  . Effusion of left knee 08/14/2017    Past Surgical History:  Procedure Laterality Date  . COLONOSCOPY WITH PROPOFOL N/A 12/06/2017   Procedure: COLONOSCOPY WITH PROPOFOL;  Surgeon: Lucilla Lame, MD;  Location: [place];  Service: Endoscopy;  Laterality: N/A;  . POLYPECTOMY  12/06/2017   Procedure: POLYPECTOMY;  Surgeon: Lucilla Lame, MD;  Location: [place];  Service: Endoscopy;;    OB History   No obstetric history on file.      Home Medications    Prior to Admission medications   Medication Sig Start Date End Date Taking? Authorizing Provider  benztropine (COGENTIN) 2 MG  tablet Take 2 mg by mouth daily.    Yes [provider]  haloperidol decanoate (HALDOL DECANOATE) 50 MG/ML injection Inject 175 mg into the muscle every 28 (twenty-eight) days.    Yes [provider]  Multiple Vitamin (MULTIVITAMIN PO) Take 1 Dose by mouth daily.   Yes [provider]  metroNIDAZOLE (FLAGYL) 500 MG tablet Take 1 tablet (500 mg total) by mouth 2 (two) times daily. 10/04/20   Margarette Canada, NP  nitrofurantoin, macrocrystal-monohydrate, (MACROBID) 100 MG capsule Take 1 capsule (100 mg total) by mouth 2 (two) times daily. X 5 days 09/12/20   Melynda Ripple, MD  phenazopyridine (PYRIDIUM) 200 MG tablet Take 1 tablet (200 mg total) by mouth 3 (three) times daily as needed for pain. 09/12/20   Melynda Ripple, MD  vitamin B-12 (CYANOCOBALAMIN) 1000 MCG tablet Take 1 tablet (1,000 mcg total) by mouth daily. 06/04/19   Earlie Server, MD    Family History Family History  Problem Relation Age of Onset  . Hypertension Mother   . Other Father        unknown medical history  . Lung cancer Maternal Grandmother   . Arthritis Sister     Social History Social History   Tobacco Use  . Smoking status: Never Smoker  . Smokeless tobacco: Never Used  Vaping Use  . Vaping Use: Never used  Substance  Use Topics  . Alcohol use: No  . Drug use: No     Allergies   Tramadol   Review of Systems Review of Systems  Constitutional: Negative for activity change, appetite change and fever.  HENT: Negative for congestion.   Respiratory: Negative for cough and shortness of breath.   Cardiovascular: Negative for chest pain.  Gastrointestinal: Positive for abdominal pain. Negative for blood in stool, diarrhea, nausea and vomiting.  Genitourinary: Positive for dysuria, frequency and urgency. Negative for flank pain, hematuria, vaginal bleeding, vaginal discharge and vaginal pain.  Musculoskeletal: Negative for back pain.  Skin: Negative for rash.  Neurological: Negative  for syncope and headaches.  Hematological: Negative.   Psychiatric/Behavioral: Negative.      Physical Exam Triage Vital Signs ED Triage Vitals  Enc Vitals Group     BP 10/04/20 1849 (!) 115/93     Pulse Rate 10/04/20 1849 62     Resp 10/04/20 1849 18     Temp 10/04/20 1849 98 F (36.7 C)     Temp Source 10/04/20 1849 Oral     SpO2 10/04/20 1849 98 %     Weight 10/04/20 1847 190 lb 4.1 oz (86.3 kg)     Height 10/04/20 1847 5\' 10"  (1.778 m)     Head Circumference --      Peak Flow --      Pain Score 10/04/20 1847 0     Pain Loc --      Pain Edu? --      Excl. in [place]? --    No data found.  Updated Vital Signs BP (!) 115/93 (BP Location: Right Arm)   Pulse 62   Temp 98 F (36.7 C) (Oral)   Resp 18   Ht 5\' 10"  (1.778 m)   Wt 190 lb 4.1 oz (86.3 kg)   LMP 01/04/2015 Comment: medicine she is on makes her not have periods. denies preg  SpO2 98%   BMI 27.30 kg/m   Visual Acuity Right Eye Distance:   Left Eye Distance:   Bilateral Distance:    Right Eye Near:   Left Eye Near:    Bilateral Near:     Physical Exam Vitals and nursing note reviewed.  Constitutional:      General: She is not in acute distress.    Appearance: Normal appearance.  HENT:     Head: Normocephalic and atraumatic.  Eyes:     General: No scleral icterus.    Extraocular Movements: Extraocular movements intact.     Conjunctiva/sclera: Conjunctivae normal.     Pupils: Pupils are equal, round, and reactive to light.  Cardiovascular:     Rate and Rhythm: Normal rate and regular rhythm.     Pulses: Normal pulses.     Heart sounds: Normal heart sounds. No murmur heard.  No gallop.   Pulmonary:     Effort: Pulmonary effort is normal.     Breath sounds: Normal breath sounds. No wheezing, rhonchi or rales.  Abdominal:     General: Abdomen is flat. Bowel sounds are normal.     Palpations: Abdomen is soft.     Tenderness: There is abdominal tenderness. There is no right CVA tenderness, left CVA  tenderness, guarding or rebound.     Comments: Patient has mild suprapubic tenderness.  Musculoskeletal:        General: No swelling or tenderness. Normal range of motion.  Skin:    General: Skin is warm and dry.     Capillary  Refill: Capillary refill takes less than 2 seconds.     Findings: No erythema or rash.  Neurological:     General: No focal deficit present.     Mental Status: She is alert and oriented to person, place, and time.  Psychiatric:        Mood and Affect: Mood normal.        Behavior: Behavior normal.        Thought Content: Thought content normal.        Judgment: Judgment normal.      UC Treatments / Results  Labs (all labs ordered are listed, but only abnormal results are displayed) Labs Reviewed  WET PREP, GENITAL - Abnormal; Notable for the following components:      Result Value   Clue Cells Wet Prep HPF POC PRESENT (*)    WBC, Wet Prep HPF POC FEW (*)    All other components within normal limits  URINALYSIS, COMPLETE (UACMP) WITH MICROSCOPIC    EKG   Radiology No results found.  Procedures Procedures (including critical care time)  Medications Ordered in UC Medications - No data to display  Initial Impression / Assessment and Plan / UC Course  I have reviewed the triage vital signs and the nursing notes.  Pertinent labs & imaging results that were available during my care of the patient were reviewed by me and considered in my medical decision making (see chart for details).   Patient is here for evaluation of UTI symptoms that she has had for the past 2 days.  Patient is in no acute distress.  Physical exam reveals no CVA tenderness and mild suprapubic pain to palpation.  Patient reports that her last UTI was about 2 weeks ago and at that time she also had bacterial vaginosis.  Patient is denying vaginal discharge at this time but will collect for swab.  Will send UA and wet prep.  Prep is positive for BV and UA is negative for  affection.  Take the Flagyl twice daily for 7 days.   Final Clinical Impressions(s) / UC Diagnoses   Final diagnoses:  BV (bacterial vaginosis)     Discharge Instructions     Take the Flagyl twice daily for 7 days.  Primary care provider if your symptoms do not resolve.    ED Prescriptions    Medication Sig Dispense Auth. Provider   metroNIDAZOLE (FLAGYL) 500 MG tablet Take 1 tablet (500 mg total) by mouth 2 (two) times daily. 14 tablet Margarette Canada, NP     PDMP not reviewed this encounter.   Margarette Canada, NP 10/04/20 2042

## 2020-10-04 NOTE — Discharge Instructions (Addendum)
Take the Flagyl twice daily for 7 days.  Primary care provider if your symptoms do not resolve.

## 2020-10-06 ENCOUNTER — Ambulatory Visit: Payer: Medicare Other | Admitting: Nurse Practitioner

## 2020-10-18 ENCOUNTER — Telehealth: Payer: Self-pay

## 2020-10-18 NOTE — Telephone Encounter (Signed)
Copied from [place] [id number]. Topic: General - Other >> Oct 17, 2020  5:19 PM Erick Blinks wrote: Reason for CRM: Pt called reporting that she may be resistant to flagyl because she is developing another bacterial vaginosis infection. Please advise, she is scheduled for Friday with Kathrine Haddock

## 2020-10-18 NOTE — Telephone Encounter (Signed)
FYI

## 2020-10-18 NOTE — Telephone Encounter (Signed)
Patient notified

## 2020-10-18 NOTE — Telephone Encounter (Signed)
Noted, recommend she maintain Friday visit so we can reassess and reswab to assess if ongoing infection + determine need for alternate medication.

## 2020-10-21 ENCOUNTER — Ambulatory Visit (INDEPENDENT_AMBULATORY_CARE_PROVIDER_SITE_OTHER): Payer: Medicare Other | Admitting: Unknown Physician Specialty

## 2020-10-21 ENCOUNTER — Encounter: Payer: Self-pay | Admitting: Unknown Physician Specialty

## 2020-10-21 ENCOUNTER — Other Ambulatory Visit: Payer: Self-pay

## 2020-10-21 VITALS — BP 115/75 | HR 60 | Temp 98.0°F | Ht 71.25 in | Wt 190.0 lb

## 2020-10-21 DIAGNOSIS — N898 Other specified noninflammatory disorders of vagina: Secondary | ICD-10-CM

## 2020-10-21 LAB — WET PREP FOR TRICH, YEAST, CLUE
Clue Cell Exam: NEGATIVE
Trichomonas Exam: NEGATIVE
Yeast Exam: NEGATIVE

## 2020-10-21 NOTE — Progress Notes (Signed)
BP 115/75   Pulse 60   Temp 98 F (36.7 C)   Ht 5' 11.25" (1.81 m)   Wt 190 lb (86.2 kg)   LMP 01/04/2015 Comment: medicine she is on makes her not have periods. denies preg  SpO2 98%   BMI 26.31 kg/m    Subjective:    Patient ID: Tina Clark, female    DOB: 1966/07/12, 54 y.o.   MRN: [id number]  HPI: Tina Clark is a 54 y.o. female  Chief Complaint  Patient presents with  . BV    Pt states she was diagnosed with BV twice in the month of October, here today to reassure its gone. Pt states she was having bad odor, states odor is now gone for the most part    Pt is here to recheck for BV as she had 2 "back to back."  Both times she received Flagyl.  She does say she has douched in the past.  She is better but wants to check.  Unable to get a urinary specimen.    Relevant past medical, surgical, family and social history reviewed and updated as indicated. Interim medical history since our last visit reviewed. Allergies and medications reviewed and updated.  Review of Systems  Per HPI unless specifically indicated above     Objective:    BP 115/75   Pulse 60   Temp 98 F (36.7 C)   Ht 5' 11.25" (1.81 m)   Wt 190 lb (86.2 kg)   LMP 01/04/2015 Comment: medicine she is on makes her not have periods. denies preg  SpO2 98%   BMI 26.31 kg/m   Wt Readings from Last 3 Encounters:  10/21/20 190 lb (86.2 kg)  10/04/20 190 lb 4.1 oz (86.3 kg)  09/12/20 190 lb 4.8 oz (86.3 kg)    Physical Exam Constitutional:      General: She is not in acute distress.    Appearance: Normal appearance. She is well-developed and well-nourished.  HENT:     Head: Normocephalic and atraumatic.  Eyes:     General: Lids are normal. No scleral icterus.       Right eye: No discharge.        Left eye: No discharge.     Conjunctiva/sclera: Conjunctivae normal.  Cardiovascular:     Rate and Rhythm: Normal rate.  Pulmonary:     Effort: Pulmonary effort is normal.  Abdominal:      Palpations: There is no hepatomegaly or splenomegaly.  Musculoskeletal:        General: Normal range of motion.  Skin:    General: Skin is intact.     Coloration: Skin is not pale.     Findings: No rash.  Neurological:     Mental Status: She is alert and oriented to person, place, and time.  Psychiatric:        Mood and Affect: Mood and affect normal.        Behavior: Behavior normal.        Thought Content: Thought content normal.        Judgment: Judgment normal.     Results for orders placed or performed during the hospital encounter of 10/04/20  Wet prep, genital   Specimen: Vaginal  Result Value Ref Range   Yeast Wet Prep HPF POC NONE SEEN NONE SEEN   Trich, Wet Prep NONE SEEN NONE SEEN   Clue Cells Wet Prep HPF POC PRESENT (A) NONE SEEN   WBC, Wet Prep HPF  POC FEW (A) NONE SEEN   Sperm NONE SEEN   Urinalysis, Complete w Microscopic Urine, Clean Catch  Result Value Ref Range   Color, Urine YELLOW YELLOW   APPearance CLEAR CLEAR   Specific Gravity, Urine 1.015 1.005 - 1.030   pH 6.5 5.0 - 8.0   Glucose, UA NEGATIVE NEGATIVE mg/dL   Hgb urine dipstick NEGATIVE NEGATIVE   Bilirubin Urine NEGATIVE NEGATIVE   Ketones, ur NEGATIVE NEGATIVE mg/dL   Protein, ur NEGATIVE NEGATIVE mg/dL   Nitrite NEGATIVE NEGATIVE   Leukocytes,Ua NEGATIVE NEGATIVE   Squamous Epithelial / LPF 0-5 0 - 5   WBC, UA 0-5 0 - 5 WBC/hpf   RBC / HPF NONE SEEN 0 - 5 RBC/hpf   Bacteria, UA NONE SEEN NONE SEEN      Assessment & Plan:   Problem List Items Addressed This Visit   None   Visit Diagnoses    Vaginal odor    -  Primary   this is improved.  Wet prep negative for cluce cells, trichomonas and yeast.  Pt ed about hygeine.     Relevant Orders   Urinalysis, Routine w reflex microscopic   WET PREP FOR TRICH, YEAST, CLUE       Follow up plan: Return if symptoms worsen or fail to improve.

## 2020-10-21 NOTE — Patient Instructions (Signed)

## 2020-10-25 ENCOUNTER — Ambulatory Visit
Admission: EM | Admit: 2020-10-25 | Discharge: 2020-10-25 | Disposition: A | Discharge status: Home / Self Care | Payer: No Typology Code available for payment source | Attending: Family Medicine | Admitting: Family Medicine

## 2020-10-25 ENCOUNTER — Encounter: Payer: Self-pay | Admitting: Emergency Medicine

## 2020-10-25 ENCOUNTER — Other Ambulatory Visit: Payer: Self-pay

## 2020-10-25 DIAGNOSIS — R300 Dysuria: Secondary | ICD-10-CM | POA: Insufficient documentation

## 2020-10-25 LAB — WET PREP, GENITAL
Clue Cells Wet Prep HPF POC: NONE SEEN
Sperm: NONE SEEN
Trich, Wet Prep: NONE SEEN
Yeast Wet Prep HPF POC: NONE SEEN

## 2020-10-25 LAB — URINALYSIS, COMPLETE (UACMP) WITH MICROSCOPIC
Bacteria, UA: NONE SEEN
Bilirubin Urine: NEGATIVE
Glucose, UA: NEGATIVE mg/dL
Hgb urine dipstick: NEGATIVE
Ketones, ur: NEGATIVE mg/dL
Nitrite: NEGATIVE
Protein, ur: NEGATIVE mg/dL
RBC / HPF: NONE SEEN RBC/hpf (ref 0–5)
Specific Gravity, Urine: <1.005 — ABNORMAL LOW (ref 1.005–1.030)
Squamous Epithelial / LPF: NONE SEEN (ref 0–5)
pH: 5.5 (ref 5.0–8.0)

## 2020-10-25 NOTE — ED Provider Notes (Signed)
[facility]    CSN: [id number] Arrival date & time: 10/25/20  0934      History   Chief Complaint Chief Complaint  Patient presents with  . Dysuria   HPI   54 year old female presents with dysuria and urinary frequency/urgency.  Symptoms started yesterday.  She states that she is experiencing dysuria and frequency/urgency.  She is concerned that she has UTI.  Patient states that she was also recently treated for bacterial vaginosis.  She would like a swab to see if this is resolved.  No fever.  No abdominal pain.  No other associated symptoms.  No other complaints.  Past Medical History:  Diagnosis Date  . Anemia   . Bipolar disorder ([place])   . History of colonic polyps   . Mental impairment   . Schizophrenia, unspecified ([place])   . Seasonal allergies   . UTI (urinary tract infection)     Patient Active Problem List   Diagnosis Date Noted  . BMI 28.0-28.9,adult 04/11/2020  . Numbness and tingling in both hands 04/11/2020  . B12 deficiency 07/08/2019  . Pruritus 05/07/2019  . Bipolar 1 disorder ([place]) 02/04/2019  . History of schizophrenia 02/04/2019  . Special screening for malignant neoplasms, colon   . Benign neoplasm of ascending colon   . Polyp of sigmoid colon   . Acute pain of left knee 08/14/2017  . Effusion of left knee 08/14/2017    Past Surgical History:  Procedure Laterality Date  . COLONOSCOPY WITH PROPOFOL N/A 12/06/2017   Procedure: COLONOSCOPY WITH PROPOFOL;  Surgeon: Lucilla Lame, MD;  Location: [place];  Service: Endoscopy;  Laterality: N/A;  . POLYPECTOMY  12/06/2017   Procedure: POLYPECTOMY;  Surgeon: Lucilla Lame, MD;  Location: [place];  Service: Endoscopy;;    OB History   No obstetric history on file.      Home Medications    Prior to Admission medications   Medication Sig Start Date End Date Taking? Authorizing Provider  haloperidol decanoate (HALDOL DECANOATE) 50 MG/ML injection Inject 175 mg into  the muscle every 28 (twenty-eight) days.    Yes [provider]  vitamin B-12 (CYANOCOBALAMIN) 1000 MCG tablet Take 1 tablet (1,000 mcg total) by mouth daily. 06/04/19  Yes Earlie Server, MD  benztropine (COGENTIN) 2 MG tablet Take 2 mg by mouth daily.  Patient not taking: Reported on 10/21/2020    [provider]  Multiple Vitamin (MULTIVITAMIN PO) Take 1 Dose by mouth daily. Patient not taking: Reported on 10/21/2020    [provider]  phenazopyridine (PYRIDIUM) 200 MG tablet Take 1 tablet (200 mg total) by mouth 3 (three) times daily as needed for pain. Patient not taking: Reported on 10/21/2020 09/12/20   Melynda Ripple, MD    Family History Family History  Problem Relation Age of Onset  . Hypertension Mother   . Other Father        unknown medical history  . Lung cancer Maternal Grandmother   . Arthritis Sister     Social History Social History   Tobacco Use  . Smoking status: Never Smoker  . Smokeless tobacco: Never Used  Vaping Use  . Vaping Use: Never used  Substance Use Topics  . Alcohol use: No  . Drug use: No     Allergies   Tramadol   Review of Systems Review of Systems Per HPI  Physical Exam Triage Vital Signs ED Triage Vitals  Enc Vitals Group     BP 10/25/20 1119 122/86  Pulse Rate 10/25/20 1115 (!) 54     Resp 10/25/20 1115 18     Temp 10/25/20 1115 97.9 F (36.6 C)     Temp Source 10/25/20 1115 Oral     SpO2 10/25/20 1115 100 %     Weight 10/25/20 1114 190 lb 0.6 oz (86.2 kg)     Height 10/25/20 1114 5\' 11"  (1.803 m)     Head Circumference --      Peak Flow --      Pain Score 10/25/20 1114 0     Pain Loc --      Pain Edu? --      Excl. in [place]? --    Updated Vital Signs BP 122/86   Pulse (!) 54   Temp 97.9 F (36.6 C) (Oral)   Resp 18   Ht 5\' 11"  (1.803 m)   Wt 86.2 kg   LMP 01/04/2015 Comment: medicine she is on makes her not have periods. denies preg  SpO2 100%   BMI 26.50 kg/m   Visual  Acuity Right Eye Distance:   Left Eye Distance:   Bilateral Distance:    Right Eye Near:   Left Eye Near:    Bilateral Near:     Physical Exam Constitutional:      General: She is not in acute distress.    Appearance: Normal appearance. She is not ill-appearing.  HENT:     Head: Normocephalic and atraumatic.  Eyes:     General:        Right eye: No discharge.        Left eye: No discharge.     Conjunctiva/sclera: Conjunctivae normal.  Cardiovascular:     Rate and Rhythm: Normal rate and regular rhythm.  Pulmonary:     Effort: Pulmonary effort is normal. No respiratory distress.  Neurological:     Mental Status: She is alert.  Psychiatric:        Mood and Affect: Mood normal.        Behavior: Behavior normal.    UC Treatments / Results  Labs (all labs ordered are listed, but only abnormal results are displayed) Labs Reviewed  WET PREP, GENITAL - Abnormal; Notable for the following components:      Result Value   WBC, Wet Prep HPF POC FEW (*)    All other components within normal limits  URINALYSIS, COMPLETE (UACMP) WITH MICROSCOPIC - Abnormal; Notable for the following components:   Specific Gravity, Urine <1.005 (*)    Leukocytes,Ua TRACE (*)    All other components within normal limits  URINE CULTURE    EKG   Radiology No results found.  Procedures Procedures (including critical care time)  Medications Ordered in UC Medications - No data to display  Initial Impression / Assessment and Plan / UC Course  I have reviewed the triage vital signs and the nursing notes.  Pertinent labs & imaging results that were available during my care of the patient were reviewed by me and considered in my medical decision making (see chart for details).     53 year old female presents with urinary symptoms.  There is no evidence of UTI based off urinalysis and microscopy today.  Sending culture.  Patient concerned regarding recent bacterial vaginosis.  This has resolved.   Wet prep negative today.  Supportive care.  Final Clinical Impressions(s) / UC Diagnoses   Final diagnoses:  Dysuria     Discharge Instructions     Lots of fluids.  Awaiting culture.  Take care  Dr. Lacinda Axon    ED Prescriptions    None     PDMP not reviewed this encounter.   Coral Spikes, [place] 10/25/20 1216

## 2020-10-25 NOTE — Discharge Instructions (Signed)
Lots of fluids.  Awaiting culture.  Take care  Dr. Lacinda Axon

## 2020-10-25 NOTE — ED Triage Notes (Signed)
Patient c/o urinary frequency that started yesterday. She denies any other symptoms but also wants to make sure her BV has cleared up.

## 2020-10-26 LAB — URINE CULTURE: Culture: NO GROWTH

## 2021-01-02 ENCOUNTER — Telehealth: Payer: Self-pay | Admitting: Nurse Practitioner

## 2021-01-02 NOTE — Telephone Encounter (Signed)
Copied from [place] [id number]. Topic: Medicare AWV >> Jan 02, 2021  2:11 PM Weston Anna wrote: Reason for CRM:  No answer unable to leave message to notify AWVS has been reschedule from 3/9 to 3/11 to be completed by phone -srs Sent patient message through [facility]

## 2021-01-11 ENCOUNTER — Ambulatory Visit: Payer: Medicare Other

## 2021-01-13 ENCOUNTER — Ambulatory Visit: Payer: Medicare (Managed Care)

## 2021-01-13 ENCOUNTER — Telehealth: Payer: Self-pay

## 2021-01-13 NOTE — Telephone Encounter (Signed)
This nurse attempted to call patient 4 times.  Times on home number and once on mobile number. Mobile number is not in service. Home number continues to ring with no voicemail available. Unable to leave a message.

## 2021-03-02 ENCOUNTER — Telehealth: Payer: Self-pay | Admitting: Nurse Practitioner

## 2021-03-02 NOTE — Telephone Encounter (Signed)
Copied from [place] [id number]. Topic: Medicare AWV >> Mar 02, 2021  2:17 PM Cher Nakai R wrote: Reason for CRM:  No answer unable to leave a message for patient to call back and schedule the Medicare Annual Wellness Visit (AWV) virtually or by telephone.  Last AWV 01/07/2020  Please schedule at anytime with [facility]Nurse Health Advisor.  45 minute appointment  Any questions, please call me at [phone]

## 2021-03-30 ENCOUNTER — Other Ambulatory Visit: Payer: Self-pay

## 2021-03-30 DIAGNOSIS — Z1231 Encounter for screening mammogram for malignant neoplasm of breast: Secondary | ICD-10-CM

## 2022-01-15 ENCOUNTER — Ambulatory Visit: Payer: Medicare (Managed Care)

## 2024-08-26 ENCOUNTER — Telehealth: Payer: Self-pay

## 2024-08-26 ENCOUNTER — Other Ambulatory Visit: Payer: Self-pay

## 2024-08-26 DIAGNOSIS — Z860100 Personal history of colon polyps, unspecified: Secondary | ICD-10-CM

## 2024-08-26 MED ORDER — NA SULFATE-K SULFATE-MG SULF 17.5-3.13-1.6 GM/177ML PO SOLN: 1.0000 | Freq: Once | ORAL | 0 refills | Status: AC

## 2024-08-26 NOTE — Telephone Encounter (Signed)
 Gastroenterology Pre-Procedure Review  Request Date: 11/18/24 Requesting Physician: Dr. Melany  PATIENT REVIEW QUESTIONS: The patient responded to the following health history questions as indicated:    1. Are you having any GI issues? no 2. Do you have a personal history of Polyps? yes (last colonoscopy performed by Dr. Jinny 12/06/2017 recommended repeat in 5 years) 3. Do you have a family history of Colon Cancer or Polyps? no 4. Diabetes Mellitus? no 5. Joint replacements in the past 12 months?no 6. Major health problems in the past 3 months?no 7. Any artificial heart valves, MVP, or defibrillator?no    MEDICATIONS & ALLERGIES:    Patient reports the following regarding taking any anticoagulation/antiplatelet therapy:   Plavix, Coumadin, Eliquis, Xarelto, Lovenox, Pradaxa, Brilinta, or Effient? no Aspirin? no  Patient confirms/reports the following medications:  Current Outpatient Medications  Medication Sig Dispense Refill   benztropine (COGENTIN) 2 MG tablet Take 2 mg by mouth daily.  (Patient not taking: Reported on 10/21/2020)     haloperidol decanoate (HALDOL DECANOATE) 50 MG/ML injection Inject 175 mg into the muscle every 28 (twenty-eight) days.      Multiple Vitamin (MULTIVITAMIN PO) Take 1 Dose by mouth daily. (Patient not taking: Reported on 10/21/2020)     phenazopyridine  (PYRIDIUM ) 200 MG tablet Take 1 tablet (200 mg total) by mouth 3 (three) times daily as needed for pain. (Patient not taking: Reported on 10/21/2020) 6 tablet 0   vitamin B-12 (CYANOCOBALAMIN) 1000 MCG tablet Take 1 tablet (1,000 mcg total) by mouth daily. 90 tablet 1   No current facility-administered medications for this visit.    Patient confirms/reports the following allergies:  Allergies  Allergen Reactions   Tramadol  Nausea Only    No orders of the defined types were placed in this encounter.   AUTHORIZATION INFORMATION Primary Insurance: 1D#: Group #:  Secondary  Insurance: 1D#: Group #:  SCHEDULE INFORMATION: Date:  Time: Location:

## 2024-08-26 NOTE — Progress Notes (Deleted)
 Gastroenterology Pre-Procedure Review  Request Date: *** Requesting Physician: Dr. PIERRETTE  PATIENT REVIEW QUESTIONS: The patient responded to the following health history questions as indicated:    1. Are you having any GI issues? {Yes/No:19989} 2. Do you have a personal history of Polyps? {Yes/No:19989} 3. Do you have a family history of Colon Cancer or Polyps? {Yes/No:19989} 4. Diabetes Mellitus? {Yes/No:19989} 5. Joint replacements in the past 12 months?{Yes/No:19989} 6. Major health problems in the past 3 months?{Yes/No:19989} 7. Any artificial heart valves, MVP, or defibrillator?{Yes/No:19989}    MEDICATIONS & ALLERGIES:    Patient reports the following regarding taking any anticoagulation/antiplatelet therapy:   Plavix, Coumadin, Eliquis, Xarelto, Lovenox, Pradaxa, Brilinta, or Effient? {Yes/No:19989} Aspirin? {Yes/No:19989}  Patient confirms/reports the following medications:  Current Outpatient Medications  Medication Sig Dispense Refill   benztropine (COGENTIN) 2 MG tablet Take 2 mg by mouth daily.  (Patient not taking: Reported on 10/21/2020)     haloperidol decanoate (HALDOL DECANOATE) 50 MG/ML injection Inject 175 mg into the muscle every 28 (twenty-eight) days.      Multiple Vitamin (MULTIVITAMIN PO) Take 1 Dose by mouth daily. (Patient not taking: Reported on 10/21/2020)     Na Sulfate-K Sulfate-Mg Sulfate concentrate (SUPREP) 17.5-3.13-1.6 GM/177ML SOLN Take 1 kit (354 mLs total) by mouth once for 1 dose. 354 mL 0   phenazopyridine  (PYRIDIUM ) 200 MG tablet Take 1 tablet (200 mg total) by mouth 3 (three) times daily as needed for pain. (Patient not taking: Reported on 10/21/2020) 6 tablet 0   vitamin B-12 (CYANOCOBALAMIN) 1000 MCG tablet Take 1 tablet (1,000 mcg total) by mouth daily. 90 tablet 1   No current facility-administered medications for this visit.    Patient confirms/reports the following allergies:  Allergies  Allergen Reactions   Tramadol  Nausea Only     No orders of the defined types were placed in this encounter.   AUTHORIZATION INFORMATION Primary Insurance: 1D#: Group #:  Secondary Insurance: 1D#: Group #:  SCHEDULE INFORMATION: Date:  Time: Location:

## 2024-11-09 ENCOUNTER — Telehealth: Payer: Self-pay

## 2024-11-09 NOTE — Telephone Encounter (Signed)
 Per pt please call  1st [phone]  2nd call [phone]

## 2024-11-09 NOTE — Telephone Encounter (Signed)
 Patient's call has been returned.  Her colonoscopy has been rescheduled to 12/28/24 still at [facility] with Dr. Melany.  Instructions updated.  Referral updated.  Kim at [facility] notified. Routed to [employer].  Thanks,  Rosaline CMA

## 2024-11-09 NOTE — Telephone Encounter (Signed)
 Per pt need to r/s

## 2024-12-28 ENCOUNTER — Encounter: Admission: RE | Payer: Self-pay | Source: Home / Self Care

## 2024-12-28 ENCOUNTER — Ambulatory Visit: Admission: RE | Admit: 2024-12-28 | Source: Home / Self Care | Admitting: Gastroenterology

## 2024-12-28 SURGERY — COLONOSCOPY
Anesthesia: Choice
